# Patient Record
Sex: Female | Born: 1955 | Race: White | Hispanic: No | Marital: Single | Smoking: Current every day smoker
Health system: Southern US, Community
[De-identification: ages and names within clinical notes are randomized; demographics above are authoritative.]

## PROBLEM LIST (undated history)

## (undated) DIAGNOSIS — R918 Other nonspecific abnormal finding of lung field: Secondary | ICD-10-CM

## (undated) DIAGNOSIS — C3490 Malignant neoplasm of unspecified part of unspecified bronchus or lung: Secondary | ICD-10-CM

## (undated) DIAGNOSIS — J45909 Unspecified asthma, uncomplicated: Secondary | ICD-10-CM

## (undated) DIAGNOSIS — F1010 Alcohol abuse, uncomplicated: Secondary | ICD-10-CM

## (undated) DIAGNOSIS — I10 Essential (primary) hypertension: Secondary | ICD-10-CM

## (undated) DIAGNOSIS — J449 Chronic obstructive pulmonary disease, unspecified: Secondary | ICD-10-CM

## (undated) HISTORY — DX: Malignant neoplasm of unspecified part of unspecified bronchus or lung: C34.90

## (undated) HISTORY — PX: OTHER SURGICAL HISTORY: SHX169

## (undated) HISTORY — DX: Other nonspecific abnormal finding of lung field: R91.8

---

## 2004-10-21 ENCOUNTER — Ambulatory Visit: Payer: Self-pay

## 2005-11-22 ENCOUNTER — Ambulatory Visit: Payer: Self-pay | Admitting: Nurse Practitioner

## 2006-01-14 ENCOUNTER — Ambulatory Visit: Payer: Self-pay | Admitting: Nurse Practitioner

## 2007-01-20 ENCOUNTER — Ambulatory Visit: Payer: Self-pay | Admitting: Family Medicine

## 2011-04-27 HISTORY — PX: HIP SURGERY: SHX245

## 2012-10-24 ENCOUNTER — Inpatient Hospital Stay: Payer: Self-pay | Admitting: Orthopedic Surgery

## 2012-10-24 LAB — URINALYSIS, COMPLETE
Bilirubin,UR: NEGATIVE
Blood: NEGATIVE
Ketone: NEGATIVE
Ph: 6 (ref 4.5–8.0)
Protein: NEGATIVE
Squamous Epithelial: <1
WBC UR: <1 /HPF (ref 0–5)

## 2012-10-24 LAB — CBC
HCT: 30.3 % — ABNORMAL LOW (ref 35.0–47.0)
HGB: 10.9 g/dL — ABNORMAL LOW (ref 12.0–16.0)
MCV: 104 fL — ABNORMAL HIGH (ref 80–100)
Platelet: 326 10*3/uL (ref 150–440)
RBC: 2.93 10*6/uL — ABNORMAL LOW (ref 3.80–5.20)
WBC: 7.7 10*3/uL (ref 3.6–11.0)

## 2012-10-24 LAB — COMPREHENSIVE METABOLIC PANEL
Alkaline Phosphatase: 80 U/L (ref 50–136)
Anion Gap: 13 (ref 7–16)
Bilirubin,Total: 0.6 mg/dL (ref 0.2–1.0)
Calcium, Total: 8.4 mg/dL — ABNORMAL LOW (ref 8.5–10.1)
Chloride: 83 mmol/L — ABNORMAL LOW (ref 98–107)
Co2: 26 mmol/L (ref 21–32)
EGFR (African American): >60
EGFR (Non-African Amer.): >60
Glucose: 87 mg/dL (ref 65–99)
Potassium: 3.4 mmol/L — ABNORMAL LOW (ref 3.5–5.1)
SGOT(AST): 40 U/L — ABNORMAL HIGH (ref 15–37)
SGPT (ALT): 25 U/L (ref 12–78)
Sodium: 122 mmol/L — ABNORMAL LOW (ref 136–145)

## 2012-10-24 LAB — PROTIME-INR
INR: 0.9
Prothrombin Time: 12.1 secs (ref 11.5–14.7)

## 2012-10-24 LAB — FOLATE: Folic Acid: 8 ng/mL (ref 3.1–100.0)

## 2012-10-25 LAB — BASIC METABOLIC PANEL
Anion Gap: 8 (ref 7–16)
Calcium, Total: 8.6 mg/dL (ref 8.5–10.1)
Co2: 28 mmol/L (ref 21–32)
EGFR (African American): >60
EGFR (Non-African Amer.): >60
Glucose: 137 mg/dL — ABNORMAL HIGH (ref 65–99)
Potassium: 5 mmol/L (ref 3.5–5.1)
Sodium: 124 mmol/L — ABNORMAL LOW (ref 136–145)

## 2012-10-26 LAB — SODIUM: Sodium: 131 mmol/L — ABNORMAL LOW (ref 136–145)

## 2012-10-26 LAB — BASIC METABOLIC PANEL
BUN: 10 mg/dL (ref 7–18)
Calcium, Total: 8.5 mg/dL (ref 8.5–10.1)
Co2: 27 mmol/L (ref 21–32)
EGFR (Non-African Amer.): >60
Potassium: 3.9 mmol/L (ref 3.5–5.1)
Sodium: 129 mmol/L — ABNORMAL LOW (ref 136–145)

## 2012-10-26 LAB — URINE CULTURE

## 2012-10-27 LAB — BASIC METABOLIC PANEL
Calcium, Total: 8.2 mg/dL — ABNORMAL LOW (ref 8.5–10.1)
Co2: 30 mmol/L (ref 21–32)
EGFR (African American): >60
EGFR (Non-African Amer.): >60
Osmolality: 264 (ref 275–301)
Sodium: 133 mmol/L — ABNORMAL LOW (ref 136–145)

## 2012-10-27 LAB — CBC WITH DIFFERENTIAL/PLATELET
Basophil #: 0 10*3/uL (ref 0.0–0.1)
Basophil %: 0.2 %
Eosinophil #: 0 10*3/uL (ref 0.0–0.7)
Eosinophil %: 0.1 %
HGB: 7.4 g/dL — ABNORMAL LOW (ref 12.0–16.0)
Lymphocyte #: 1.5 10*3/uL (ref 1.0–3.6)
MCH: 38.1 pg — ABNORMAL HIGH (ref 26.0–34.0)
MCV: 106 fL — ABNORMAL HIGH (ref 80–100)
Monocyte #: 0.4 x10 3/mm (ref 0.2–0.9)
Monocyte %: 7.4 %
Neutrophil #: 3.5 10*3/uL (ref 1.4–6.5)
Neutrophil %: 64.7 %
RBC: 1.94 10*6/uL — ABNORMAL LOW (ref 3.80–5.20)
RDW: 12.7 % (ref 11.5–14.5)
WBC: 5.5 10*3/uL (ref 3.6–11.0)

## 2012-10-27 LAB — HEMOGLOBIN: HGB: 6.6 g/dL — ABNORMAL LOW (ref 12.0–16.0)

## 2012-10-28 LAB — BASIC METABOLIC PANEL
BUN: 4 mg/dL — ABNORMAL LOW (ref 7–18)
Creatinine: 0.67 mg/dL (ref 0.60–1.30)
EGFR (African American): >60
Glucose: 79 mg/dL (ref 65–99)

## 2012-10-29 LAB — BASIC METABOLIC PANEL
Anion Gap: 3 — ABNORMAL LOW (ref 7–16)
Calcium, Total: 8.4 mg/dL — ABNORMAL LOW (ref 8.5–10.1)
Chloride: 91 mmol/L — ABNORMAL LOW (ref 98–107)
Co2: 34 mmol/L — ABNORMAL HIGH (ref 21–32)
Creatinine: 0.68 mg/dL (ref 0.60–1.30)
EGFR (African American): >60
EGFR (Non-African Amer.): >60
Glucose: 96 mg/dL (ref 65–99)
Osmolality: 253 (ref 275–301)

## 2012-10-29 LAB — CBC WITH DIFFERENTIAL/PLATELET
Basophil #: 0 10*3/uL (ref 0.0–0.1)
Eosinophil #: 0 10*3/uL (ref 0.0–0.7)
HGB: 8.8 g/dL — ABNORMAL LOW (ref 12.0–16.0)
Lymphocyte #: 1.2 10*3/uL (ref 1.0–3.6)
MCHC: 35.4 g/dL (ref 32.0–36.0)
MCV: 97 fL (ref 80–100)
Monocyte #: 0.5 x10 3/mm (ref 0.2–0.9)
Monocyte %: 9.8 %
Neutrophil %: 66.4 %
Platelet: 275 10*3/uL (ref 150–440)
RBC: 2.57 10*6/uL — ABNORMAL LOW (ref 3.80–5.20)
WBC: 5 10*3/uL (ref 3.6–11.0)

## 2013-08-04 DIAGNOSIS — M549 Dorsalgia, unspecified: Secondary | ICD-10-CM | POA: Insufficient documentation

## 2013-08-04 DIAGNOSIS — I10 Essential (primary) hypertension: Secondary | ICD-10-CM | POA: Insufficient documentation

## 2013-08-04 DIAGNOSIS — E871 Hypo-osmolality and hyponatremia: Secondary | ICD-10-CM | POA: Insufficient documentation

## 2013-08-04 DIAGNOSIS — J439 Emphysema, unspecified: Secondary | ICD-10-CM | POA: Insufficient documentation

## 2013-08-04 DIAGNOSIS — M1990 Unspecified osteoarthritis, unspecified site: Secondary | ICD-10-CM | POA: Insufficient documentation

## 2013-08-29 DIAGNOSIS — E119 Type 2 diabetes mellitus without complications: Secondary | ICD-10-CM | POA: Insufficient documentation

## 2014-01-28 DIAGNOSIS — M533 Sacrococcygeal disorders, not elsewhere classified: Secondary | ICD-10-CM | POA: Insufficient documentation

## 2014-01-28 DIAGNOSIS — M5417 Radiculopathy, lumbosacral region: Secondary | ICD-10-CM | POA: Insufficient documentation

## 2014-03-01 DIAGNOSIS — E639 Nutritional deficiency, unspecified: Secondary | ICD-10-CM | POA: Insufficient documentation

## 2014-04-26 HISTORY — PX: HIP PINNING: SHX1757

## 2014-06-25 ENCOUNTER — Ambulatory Visit: Payer: Self-pay | Admitting: Physical Medicine and Rehabilitation

## 2014-08-16 NOTE — Consult Note (Signed)
PATIENT NAME:  Rachael Palmer, Rachael Palmer MR#:  [id number] DATE OF BIRTH:  1955-12-21  DATE OF CONSULTATION:  10/24/2012  REFERRING PHYSICIAN:  Laurene Footman, MD CONSULTING PHYSICIAN:  [place] Sink, MD ADMITTING PHYSICIAN:  Latina Craver, MD  CHIEF COMPLAINT: Right hip pain.   This is a very nice 59 year old female with history of COPD, asthma as a child, current smoker, chronic back pain, GERD, alcohol abuse, who comes today with a history of getting out of bed last night in her bedroom and tripping falling. At that moment, she had severe pain in her right hip and she was not able to get up. She lives with her mother and she called for her help. Her mother helped her get back in bed, where she stayed during the night, and this morning whenever she got out of bed she was not able to put any weight on her leg, for what she was brought in to the Emergency Department. On evaluation by the Emergency Department physician, she was found to have right hip intertrochanteric fracture, for what Dr. Rudene Christians was consulted. Dr. Rudene Christians evaluated the patient and scheduled her to have open reduction and internal fixation this afternoon. I was consulted for management of medical conditions and surgical clearance.   As far as her COPD, the patient states that she does not have COPD, although reviewing her history, she is already taking Spiriva inhalers and she is a current smoker. 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 I do not have any information about her last PFTs. The patient states that she does not remember ever having one. The patient has a really good ability to ambulate. Prior to this, she states that she can walk a mile whenever she goes to the beach and not being short of breath, but on further discussion, it is obvious that the patient does not go to the beach very often. She is not walking much at home because of her chronic pain. She cannot go up stairs,  mostly because of her chronic pain. The patient has not had any recent infection. The last time that she had a lung infection was a pneumonia and it was 2 years ago. She states that she is taking her flu shots and her pneumonia shots are current.   She does not have any coronary artery disease. She is a heavy smoker, but does not seem to have any established peripheral artery disease. She denies any claudication with ambulation. Her pulses are palpable.   Overall, the patient is at low to moderate risk for surgery based on her COPD status. This morning she was wheezing, for what we are going to maximize her therapy. We are going to give her steroids, inhalers, and continue her current inhaler (Spiriva). We are going to add on inhaled steroids and the choice we are going to use is Flovent. The patient is stable. She has been put on oxygen, but her oxygen saturations have been 100% on 2 liters. Overall, the patient is going to go for surgery today and she will be cleared for that.   REVIEW OF SYSTEMS: Twelve-system review of systems: CONSTITUTIONAL: No fever, fatigue. No weakness. No weight loss or weight gain recently.  EYES: No blurry vision, double vision or inflammation.  EARS, NOSE, THROAT: No difficulty swallowing. No significant changes in her voice recently. No postnasal drip. No nasal discharge.  RESPIRATORY: Positive cough and wheezing every morning as a baseline, and it  clears up during the day. Positive for asthma as a child and COPD as an adult. No painful respirations. No hemoptysis. No pneumonia recently, last time 2 years ago. Flu shot and pneumonia shot updated.  GASTROINTESTINAL: No nausea, vomiting, abdominal pain, constipation, diarrhea or melena.  CARDIOVASCULAR: No chest pain, orthopnea. No edema. No palpitations or syncope. At this moment, the patient takes a water pill because in the past she had lower extremity edema, but she denies any history of CHF.  GENITOURINARY: No dysuria,  hematuria or changes in frequency.  GYNECOLOGIC: No breast masses.  ENDOCRINE: No polyuria, polydipsia, polyphagia, cold or heat intolerance.  HEMATOLOGIC AND LYMPHATIC: No anemia that she knows of. No easy bruising or bleeding. The patient does have decreased hemoglobin evident right now, which is probably likely due to chronic disease anemia. SKIN: No rashes, petechiae or moles changing.  MUSCULOSKELETAL: Positive chronic back pain, chronic neck pain, positive hip pain in the right hip. No gout.  NEUROLOGIC: No numbness, tingling. No CVAs or TIAs. No memory loss. No seizures.  PSYCHIATRIC: Denies any depression, insomnia or agitation.   PAST MEDICAL HISTORY: 1.  COPD.  2.  History of asthma as a child/reactive airway disease.  3.  GERD.  4.  Hypertension.  5.  Chronic back pain.   ALLERGIES: THE PATIENT IS ALLERGIC TO ASPIRIN AND PENICILLIN.   SOCIAL HISTORY: The patient is a smoker, smokes all her life since 3. She smokes 1 pack a day. She drinks 6-pack of beers a day, sometimes more. She is disabled, on SSI due to her COPD and back pain, and she lives with her mother. She is not married. She has 3 pregnancies.   FAMILY HISTORY: Positive for some type of cancer in her aunt, diabetes in her aunt. Her mother had significant history of peptic ulcer disease with GI bleeding.   PAST SURGICAL HISTORY: Positive for open reduction and internal fixation of the right forearm and 3 C-sections.   CURRENT MEDICATIONS: Tramadol 50 mg 1 to 2 every 4 hours as needed for pain, torsemide 5 mg once a day, Spiriva 18 mcg once a day, ProAir HFA 90 mcg 2 puffs 4 times a day, Klor-Con 10 mEq once a day, diltiazem 60 mg 1 tablet 2 times a day, carisoprodol 350 mg take 1/2 tablet 3 times a day as needed for pain.   PRIMARY CARE PHYSICIAN: Dr. Newman Pies.   PHYSICAL EXAMINATION: VITAL SIGNS: Blood pressure 103/69, pulse 110, respirations 20, temperature 97.7.  GENERAL: The patient is alert, oriented x 3.  No acute distress. No respiratory distress. Hemodynamically stable. The patient is lying down on the bed and at this moment is not complaining of any pain.  HEENT: Pupils are equal and reactive. Extraocular movements are intact. Mucosae are moist. Anicteric sclerae. Pink conjunctivae. No oral lesions. No oropharyngeal exudates. Ears without any discharge. No external abnormalities. Nose without any nasal lesions or drainage. Head normocephalic, atraumatic.  NECK: Supple. No JVD. No thyromegaly. No adenopathy. No carotid bruits. No rigidity. No stridor.  RESPIRATORY: Decreased respiratory sounds in both bases. The patient was wheezing earlier, but it improved a little bit after oxygen and nebulizers. At this moment, her lungs are clear but with decreased respiratory sounds bilaterally. No dullness to percussion. No use of accessory muscles.  CARDIOVASCULAR: Regular rate and rhythm. No murmurs, rubs or gallops are appreciated. No displacement of PMI. No tenderness to palpation anterior chest wall.  GASTROINTESTINAL: Abdomen is soft, nontender, nondistended. No hepatosplenomegaly. No masses.  Bowel sounds are positive.  GENITOURINARY: No external lesions.  EXTREMITIES: No edema, cyanosis or clubbing. Pulses +2. Capillary refill less than 3 seconds.  MUSCULOSKELETAL: Deformity of right hip with external rotation. Tenderness to palpation. No significant hematomas. No other joint deformity. No swelling of any joints. Back is nontender to palpation of spinal processes.  SKIN: Dry skin. No rashes, petechiae. Decreased turgor.  LYMPHATIC: Negative for lymphadenopathy in neck or supraclavicular areas.  VASCULAR: Pulses +2. Capillary refill less than 3 distally.  NEUROLOGIC: Cranial nerves II through XII intact. Sensation is normal distally. Strength seems to be equal in upper extremities. Unable to compare lower extremities due to the right hip fracture.  PSYCHIATRIC: Negative for agitation. The patient is alert,  oriented x 3, cooperative.   LABORATORY, DIAGNOSTIC AND RADIOLOGICAL DATA: EKG: Normal sinus rhythm. No ST depression or elevation. No significant abnormalities. No LVH. Sodium is 122 glucose 87, BUN 5, creatinine 0.63, potassium 3.4, calcium 8.4. AST is elevated at 40, other LFTs within normal limits. White blood count is 7.7; hemoglobin is 10.9, platelet count 326, MCV elevated, MCH elevated. INR is 0.9. Urinalysis: No red blood cells, no white blood cells, no nitrites or leukocyte esterase. Chest x-ray: No evidence of acute cardiopulmonary abnormalities. Underlying COPD.  ASSESSMENT AND PLAN: A 59 year old female with history of chronic obstructive pulmonary disease, hypertension, smoker, alcoholic, comes with a hip fracture of the right intertrochanteric hip.  1.  Hip fracture: The patient is cleared for surgery at this moment. She is low to moderate risk based on chronic obstructive pulmonary disease. She does not have any coronary artery disease or any evident peripheral vascular disease. The patient is going to be maximized in her treatment. Nebulizers every 4 hours with albuterol, Atrovent. Continue her home Spiriva. Continue steroids right now, as the patient was wheezing this morning, low dose, 40 mg every 8 hours and taper down after surgery. Continue oxygen supplementation as needed. The patient has not had any recent pulmonary infection. She is updated on her flu shot and pneumonia shot. The patient is at this moment stable, not in any significant respiratory distress, for what it will be okay to proceed with the surgical procedure. The patient does not have any active coronary artery disease or congestive heart failure. She does take diuretics, but these have been prescribed because of high blood pressure and lower extremity edema. I do not have an echo on chart. I do not think at this moment it is going to be necessary, although we are going to be careful with fluid management. I am changing the  fluids ordered by orthopedics to normal saline due to her low sodium. 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  2.  Hypertension: The patient has stable high blood pressure. Continue diltiazem.  3.  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 Continue normal saline and CIWA protocol.  4.  EtOH: CIWA protocol, as the patient is a drinker, drinks more than a 6-pack a day.  5.  Tobacco cessation counseling has been given to the patient for 4 minutes. The patient states that she does not want to quit smoking. Education given to the patient. 6.  Deep vein thrombosis prophylaxis with Lovenox after surgery.  7.  Gastrointestinal prophylaxis: Add Protonix, as the patient is going to be on  steroids.   TIME SPENT: I spent about 45 minutes with this patient.   Thank you, Dr. Rudene Christians, for allowing me to participate in the care of your nice patient. Will follow up along with you. Please call me with any questions.   ____________________________ Zavala Sink, MD [email or web address] D: 10/24/2012 12:48:11 ET T: 10/24/2012 13:12:57 ET JOB#: [id number]  cc: Boaz Sink, MD, <Dictator> Alphons Burgert America Brown MD ELECTRONICALLY SIGNED 10/25/2012 15:15

## 2014-08-16 NOTE — Discharge Summary (Signed)
PATIENT NAME:  Rachael Palmer, Rachael Palmer MR#:  [id number] DATE OF BIRTH:  December 02, 1955  Dictated for Hessie Knows, MD  DATE OF ADMISSION:  10/24/2012 DATE OF DISCHARGE:  10/29/2012  ADMITTING DIAGNOSIS: Right proximal femur fracture.   DISCHARGE DIAGNOSIS: Right proximal femur fracture.   CONSULTATIONS: Dr. Laurin Coder, hospitalist;  FAMILY DR. Dr. Frazier Richards.   HISTORY: The patient is a 59 year old who suffered a fall on the night prior to her admission in her bedroom. She was unable to get up. Her mother has to assist her back in bed. She stayed there during the night and on the morning of admission was unable to bear weight and subsequently was brought to [facility] ER, where x-rays were taken which revealed an intertrochanteric hip fracture. The patient had been previously ambulating without any assistive devices. She had not been having problems with walking, although she had  trouble with breathing at times with certain activities. After discussion of the risks and benefits of surgical intervention, the patient expressed her understanding of the risks and benefits and agreed with plans for surgical intervention. Subsequently, she was admitted to the hospital.   PROCEDURE: Open reduction and internal fixation of right intertrochanteric hip fracture, closed reduction right fifth finger.   IMPLANTS UTILIZED: Biomet Affixus hip fracture nail, right 130-degree, 9 x 340 mm with a 95 mm lag screw and 34 mm cortical bone locking screw.   The patient tolerated the procedure very well. She had no complications. She was then taken to the PACU where she was stabilized, then transferred to the orthopedic floor. She began receiving anticoagulation therapy of Lovenox 30 mg subQ every 12 hours per anesthesia and pharmacy protocol. She was fitted with TED stockings bilaterally. These were allowed to be removed  1 hour per 8 hour shift. The patient was also fitted with the AVI compression foot  pumps bilaterally, set at 80 mmHg. Her calves have been nontender. There has been no evidence of any DVTs to the lower extremity. Negative Homans sign. Heels were elevated off the bed using rolled towels.   The patient has denied any chest pains or shortness of breath. Vital signs have been stable. She has been afebrile. Hemodynamically, the patient's hemoglobin did drop on day 3 to 7.4. A repeat hemoglobin that same day she had dropped to 6.6. Subsequently she was given 1 unit of packed RBCs. Followup hemoglobin was 9.2.   Physical therapy was initiated on day 1 for gait training and transfers. She has done very well. Upon being discharged, she was ambulating greater than 100 feet. She was able go up and down 4 steps with independent bed-to-chair transfers. Occupational therapy was also initiated on day  1 for ADL and assistive devices.   The patient's IV, Foley and Hemovac were DC'd day 2, along with a dressing change. The wounds were free of any drainage or signs of infection. Minimal to no swelling was noted to the thigh. She has excellent range of motion of the knee and the hip. Minimal discomfort was noted. She tolerated exercise in bed very well.   The patient is being discharged to home in improved, stable condition.   DISCHARGE INSTRUCTIONS: She may weight-bear as tolerated. Continue using a walker until cleared by physical therapy. Go to a quad cane. The patient is to continue with the TED stockings. These are to be worn during the day, but may be removed at night.   She was placed on a regular diet. Change dressing as needed.  She will need to follow up in the clinic in 2 weeks, sooner if any temperatures of 101.5 or greater, or excessive bleeding. She is not to take a shower until the staples are removed.   She is to resume her regular medications that she was on prior to admission.   She was given a prescription for Norco, 1 to 2 tablets q. 4 to 6 hours p.r.n. for pain, Tramadol    50 to 100 mg every 4 to 6 hours p.r.n. for pain, and Lovenox 40 mg subcutaneously daily for 14 days.   She was instructed that she needed to discontinue her smoking or at least significantly reduce her smoking due to the fact that this interferes with the healing process of the tissue and of the bone. The patient states that she understands.   DRUG ALLERGIES: ASPIRIN AND PENICILLIN.   PAST MEDICAL HISTORY: 1.  Chronic obstructive pulmonary disease; reactive airway disease.  2.  Hypertension.  3.  Chronic low back pain.  4.  Depression.     ____________________________ Vance Peper, PA [id number] D: 10/29/2012 08:16:17 ET T: 10/29/2012 10:47:29 ET JOB#: [id number]  cc: Vance Peper, PA, <Dictator> Jemia Fata PA ELECTRONICALLY SIGNED 10/31/2012 22:14

## 2014-08-16 NOTE — H&P (Signed)
PATIENT NAME:  Rachael Palmer, MEFFERD MR#:  [id number] DATE OF BIRTH:  [birth year]  DATE OF ADMISSION:  10/24/2012  CHIEF COMPLAINT: Right hip pain.   HISTORY OF PRESENT ILLNESS: The patient is a 59 year old, who suffered a fall last night in her bedroom. She could not get up. Her mother got her back into bed. She stayed there all night and this morning was unable to bear weight. She was brought into the Emergency Room for evaluation and treatment. She was found to have an intertrochanteric hip fracture and is being admitted for treatment of this. She has been previously ambulatory without assistive device. She has not had trouble with walking, although she does have trouble with breathing that sometimes affects activities.   PAST MEDICAL HISTORY: Remarkable for COPD, reactive airway disease, hypertension, she also has a history of chronic back pain and depression.   SOCIAL HISTORY: She smokes a pack per day. She is a nondrinker and is unable to work secondary to COPD.   FAMILY HISTORY: Positive for liver disease and asthma.   MEDICATIONS ON ADMISSION: Tramadol 50 mg as needed for pain, soma 350 mg half to 1 tablet t.i.d. as needed for spasm and back pain, Pro-Air HFA inhaler 2 puffs q.4 to 6 hours p.r.n. for wheezing, trazodone 100 mg daily at night, diltiazem 60 mg b.i.d., torsemide 5 mg daily, Klor-Con 10 mEq b.i.d. and Spiriva 1 puff daily.  ALLERGIES: ASPIRIN AND PENICILLIN.   REVIEW OF SYSTEMS: Positive just for right hip pain.   PHYSICAL EXAMINATION:  GENERAL: 5 white female, who appears older than her stated age, in a great deal of distress secondary to hip pain.  HEENT: Unremarkable.  LUNGS: She has inspiratory and expiratory wheezing.  HEART: Regular rate and rhythm. EXTREMITIES: She has a shortened and externally rotated right lower extremity with palpable pulses. Sensation intact. Skin intact.   X-rays show a comminuted intertrochanteric fracture, right proximal femur.   CLINICAL  IMPRESSION: Right proximal femur fracture.   PLAN: ORIF with a long rod later today if medically cleared. The risks, benefits, and possible complications were discussed.    ____________________________ Laurene Footman, MD [email or web address] D: 10/24/2012 11:08:50 ET T: 10/24/2012 11:21:45 ET JOB#: [id number]  cc: Laurene Footman, MD, <Dictator> Laurene Footman MD ELECTRONICALLY SIGNED 10/24/2012 17:41

## 2014-08-16 NOTE — Op Note (Signed)
PATIENT NAME:  Rachael Palmer, Rachael Palmer MR#:  [id number] DATE OF BIRTH:  Sep 15, 1955  DATE OF PROCEDURE:  10/26/2012  PREOPERATIVE DIAGNOSIS:  Right intertrochanteric hip fracture, right little finger proximal phalanx fracture.   POSTOPERATIVE DIAGNOSIS:  Right intertrochanteric hip fracture, right little finger proximal phalanx fracture.   PROCEDURE:  ORIF right intertrochanteric hip fracture, closed reduction right fifth finger.   ANESTHESIA:  Spinal.   SURGEON:  Laurene Footman, M.D.   DESCRIPTION OF PROCEDURE:  The patient was brought to the Operating Room and after a spinal anesthesia was obtained with the patient still having some sedation, a timeout procedure was carried out and the little finger reduction was carried out using the pencil technique.  A pen was placed at the base of the proximal phalanx and with the finger in extension, radial deviated pressure was applied and there was a pop and the ulnar deviation was corrected.  Buddy-taping was then carried out to the ring finger.  The patient was then transferred to the fracture table with a left leg in the well leg holder, right foot in the traction boot.  With traction applied there was a near anatomic alignment.  The hip was then prepped and draped using the barrier drape method.  After patient identification and timeout procedure were carried out again, a proximal skin incision was made and a guidewire inserted through the tip of the trochanter.  Proximal reaming carried out and a guidewire inserted down the canal and rod length chosen.  Reaming was carried out with an 11 mm reamer and this was a snug fit so the 9 x 340 mm rod was obtained and inserted down the canal.  The proximal lag screw was then inserted through a small lateral incision getting into the center/center of the head.  First measuring the  guidewire, drilling and then placing a 95 mm lag screw.  This was then set after compression was applied at the fracture site and having released  traction.  Next, attention was turned distally where through the slotted hole, a small stab incision was made with drill and then placing of a 34 mm cortical bone screw, 5 mm in diameter.  After all hardware was in place and permanent C-arm views were obtained, the wounds were irrigated and closed with #1 Vicryl deep, 2-0 Vicryl subcutaneously and skin staples.  Xeroform, 4 x 4's, ABD and tape applied.  The patient was sent to the recovery room in stable condition.   ESTIMATED BLOOD LOSS:  150 mL.   COMPLICATIONS:  None.   SPECIMEN:  None.   IMPLANTS:  Biomet Affixus hip fracture nail, right 130 degree, 9 x 340 mm with a 95 mm lag screw and 34 mm cortical bone locking screw.    ____________________________ Laurene Footman, MD [email or web address]:[id number] D: 10/26/2012 19:40:08 ET T: 10/27/2012 04:34:03 ET JOB#: [id number]  cc: Laurene Footman, MD, <Dictator> Laurene Footman MD ELECTRONICALLY SIGNED 10/29/2012 19:37

## 2014-08-16 NOTE — Consult Note (Signed)
Brief Consult Note: Diagnosis: hip fracture, copd, etoh,.   Patient was seen by consultant.   Recommend to proceed with surgery or procedure.   Orders entered.   Comments: 59 yo femael witht copd/ asthma smoker and etoh abuse. surgery today. maximize tx for copd. cont spiriva, add inhaled steroid. nebs scheduled q4, sterids '40mg'$  q 8hs as per was wheezing this am. ivf change to ns as pt has low na likely due to beer potomania. ciwa protocol.  Electronic Signatures for Addendum Section:   Sink (MD) (Signed Addendum 02-Jul-14 00:40)  REPEATED NA PRIOR TO THE SURGERY IS LOWER.120 SURGERY CANCELLED. PT WILL GET AN INCREASE ON IVF UP TO 80ML, + BANANA BAG.   Electronic Signatures: James Ivanoff, Roselie Awkward (MD)  (Signed 01-Jul-14 12:30)  Authored: Brief Consult Note   Last Updated: 02-Jul-14 00:40 by James Ivanoff, Roselie Awkward (MD)

## 2014-09-16 DIAGNOSIS — S32000A Wedge compression fracture of unspecified lumbar vertebra, initial encounter for closed fracture: Secondary | ICD-10-CM | POA: Insufficient documentation

## 2014-11-01 ENCOUNTER — Other Ambulatory Visit: Payer: Self-pay | Admitting: Internal Medicine

## 2014-11-01 DIAGNOSIS — R937 Abnormal findings on diagnostic imaging of other parts of musculoskeletal system: Secondary | ICD-10-CM

## 2014-11-01 DIAGNOSIS — R911 Solitary pulmonary nodule: Secondary | ICD-10-CM

## 2014-11-04 ENCOUNTER — Ambulatory Visit
Admission: RE | Admit: 2014-11-04 | Discharge: 2014-11-04 | Disposition: A | Discharge status: Home / Self Care | Payer: Medicare Other | Source: Ambulatory Visit | Attending: Internal Medicine | Admitting: Internal Medicine

## 2014-11-04 DIAGNOSIS — Z09 Encounter for follow-up examination after completed treatment for conditions other than malignant neoplasm: Secondary | ICD-10-CM | POA: Diagnosis present

## 2014-11-04 DIAGNOSIS — M4856XA Collapsed vertebra, not elsewhere classified, lumbar region, initial encounter for fracture: Secondary | ICD-10-CM | POA: Insufficient documentation

## 2014-11-04 DIAGNOSIS — M4854XA Collapsed vertebra, not elsewhere classified, thoracic region, initial encounter for fracture: Secondary | ICD-10-CM | POA: Diagnosis not present

## 2014-11-04 DIAGNOSIS — M5134 Other intervertebral disc degeneration, thoracic region: Secondary | ICD-10-CM | POA: Diagnosis not present

## 2014-11-04 DIAGNOSIS — R937 Abnormal findings on diagnostic imaging of other parts of musculoskeletal system: Secondary | ICD-10-CM

## 2014-11-04 DIAGNOSIS — M2578 Osteophyte, vertebrae: Secondary | ICD-10-CM | POA: Diagnosis not present

## 2014-11-04 DIAGNOSIS — M5124 Other intervertebral disc displacement, thoracic region: Secondary | ICD-10-CM | POA: Diagnosis not present

## 2014-11-04 DIAGNOSIS — R911 Solitary pulmonary nodule: Secondary | ICD-10-CM

## 2014-11-04 HISTORY — DX: Unspecified asthma, uncomplicated: J45.909

## 2014-11-04 HISTORY — DX: Essential (primary) hypertension: I10

## 2014-11-04 MED ORDER — IOHEXOL 300 MG/ML  SOLN
50.0000 mL | Freq: Once | INTRAMUSCULAR | Status: AC | PRN
  Administered 2014-11-04: 50 mL via INTRAVENOUS

## 2014-11-07 ENCOUNTER — Other Ambulatory Visit: Payer: Medicare Other | Admitting: Specialist

## 2014-11-07 DIAGNOSIS — R918 Other nonspecific abnormal finding of lung field: Secondary | ICD-10-CM

## 2014-11-14 ENCOUNTER — Encounter
Admission: RE | Admit: 2014-11-14 | Discharge: 2014-11-14 | Disposition: A | Discharge status: Home / Self Care | Payer: Medicare Other | Source: Ambulatory Visit | Attending: Specialist | Admitting: Specialist

## 2014-11-14 DIAGNOSIS — J984 Other disorders of lung: Secondary | ICD-10-CM | POA: Insufficient documentation

## 2014-11-14 DIAGNOSIS — R918 Other nonspecific abnormal finding of lung field: Secondary | ICD-10-CM

## 2014-11-14 LAB — GLUCOSE, CAPILLARY: Glucose-Capillary: 81 mg/dL (ref 65–99)

## 2014-11-14 MED ORDER — FLUDEOXYGLUCOSE F - 18 (FDG) INJECTION
12.3600 | Freq: Once | INTRAVENOUS | Status: AC | PRN
  Administered 2014-11-14: 12.36 via INTRAVENOUS

## 2014-11-29 ENCOUNTER — Inpatient Hospital Stay
Admission: EM | Admit: 2014-11-29 | Discharge: 2014-12-03 | DRG: 480 | Disposition: A | Discharge status: Home Health | Payer: Medicare Other | Attending: Internal Medicine | Admitting: Internal Medicine

## 2014-11-29 ENCOUNTER — Emergency Department: Payer: Medicare Other

## 2014-11-29 ENCOUNTER — Encounter: Payer: Self-pay | Admitting: Emergency Medicine

## 2014-11-29 DIAGNOSIS — J45909 Unspecified asthma, uncomplicated: Secondary | ICD-10-CM | POA: Diagnosis present

## 2014-11-29 DIAGNOSIS — E871 Hypo-osmolality and hyponatremia: Secondary | ICD-10-CM | POA: Diagnosis present

## 2014-11-29 DIAGNOSIS — E43 Unspecified severe protein-calorie malnutrition: Secondary | ICD-10-CM | POA: Insufficient documentation

## 2014-11-29 DIAGNOSIS — R918 Other nonspecific abnormal finding of lung field: Secondary | ICD-10-CM | POA: Diagnosis present

## 2014-11-29 DIAGNOSIS — Y92009 Unspecified place in unspecified non-institutional (private) residence as the place of occurrence of the external cause: Secondary | ICD-10-CM | POA: Diagnosis not present

## 2014-11-29 DIAGNOSIS — M1990 Unspecified osteoarthritis, unspecified site: Secondary | ICD-10-CM | POA: Diagnosis present

## 2014-11-29 DIAGNOSIS — Z6820 Body mass index (BMI) 20.0-20.9, adult: Secondary | ICD-10-CM

## 2014-11-29 DIAGNOSIS — Z7982 Long term (current) use of aspirin: Secondary | ICD-10-CM

## 2014-11-29 DIAGNOSIS — Z96641 Presence of right artificial hip joint: Secondary | ICD-10-CM | POA: Diagnosis present

## 2014-11-29 DIAGNOSIS — W010XXA Fall on same level from slipping, tripping and stumbling without subsequent striking against object, initial encounter: Secondary | ICD-10-CM | POA: Diagnosis present

## 2014-11-29 DIAGNOSIS — Z8249 Family history of ischemic heart disease and other diseases of the circulatory system: Secondary | ICD-10-CM | POA: Diagnosis not present

## 2014-11-29 DIAGNOSIS — S72009A Fracture of unspecified part of neck of unspecified femur, initial encounter for closed fracture: Secondary | ICD-10-CM

## 2014-11-29 DIAGNOSIS — F17210 Nicotine dependence, cigarettes, uncomplicated: Secondary | ICD-10-CM | POA: Diagnosis present

## 2014-11-29 DIAGNOSIS — K7460 Unspecified cirrhosis of liver: Secondary | ICD-10-CM | POA: Diagnosis present

## 2014-11-29 DIAGNOSIS — M4854XA Collapsed vertebra, not elsewhere classified, thoracic region, initial encounter for fracture: Secondary | ICD-10-CM | POA: Diagnosis present

## 2014-11-29 DIAGNOSIS — J449 Chronic obstructive pulmonary disease, unspecified: Secondary | ICD-10-CM | POA: Diagnosis present

## 2014-11-29 DIAGNOSIS — Z880 Allergy status to penicillin: Secondary | ICD-10-CM

## 2014-11-29 DIAGNOSIS — M8580 Other specified disorders of bone density and structure, unspecified site: Secondary | ICD-10-CM | POA: Diagnosis present

## 2014-11-29 DIAGNOSIS — Z7983 Long term (current) use of bisphosphonates: Secondary | ICD-10-CM | POA: Diagnosis not present

## 2014-11-29 DIAGNOSIS — G8929 Other chronic pain: Secondary | ICD-10-CM | POA: Diagnosis present

## 2014-11-29 DIAGNOSIS — S72002A Fracture of unspecified part of neck of left femur, initial encounter for closed fracture: Secondary | ICD-10-CM

## 2014-11-29 DIAGNOSIS — I10 Essential (primary) hypertension: Secondary | ICD-10-CM | POA: Diagnosis present

## 2014-11-29 DIAGNOSIS — R64 Cachexia: Secondary | ICD-10-CM | POA: Diagnosis present

## 2014-11-29 DIAGNOSIS — F1010 Alcohol abuse, uncomplicated: Secondary | ICD-10-CM | POA: Diagnosis present

## 2014-11-29 DIAGNOSIS — S72142A Displaced intertrochanteric fracture of left femur, initial encounter for closed fracture: Principal | ICD-10-CM | POA: Diagnosis present

## 2014-11-29 DIAGNOSIS — Z7952 Long term (current) use of systemic steroids: Secondary | ICD-10-CM

## 2014-11-29 DIAGNOSIS — E876 Hypokalemia: Secondary | ICD-10-CM | POA: Diagnosis not present

## 2014-11-29 DIAGNOSIS — R0602 Shortness of breath: Secondary | ICD-10-CM

## 2014-11-29 DIAGNOSIS — Z79899 Other long term (current) drug therapy: Secondary | ICD-10-CM

## 2014-11-29 HISTORY — DX: Chronic obstructive pulmonary disease, unspecified: J44.9

## 2014-11-29 HISTORY — DX: Alcohol abuse, uncomplicated: F10.10

## 2014-11-29 HISTORY — DX: Other nonspecific abnormal finding of lung field: R91.8

## 2014-11-29 LAB — BASIC METABOLIC PANEL
ANION GAP: 14 (ref 5–15)
BUN: <5 mg/dL — ABNORMAL LOW (ref 6–20)
CO2: 29 mmol/L (ref 22–32)
Calcium: 8.4 mg/dL — ABNORMAL LOW (ref 8.9–10.3)
Chloride: 73 mmol/L — ABNORMAL LOW (ref 101–111)
Creatinine, Ser: 0.36 mg/dL — ABNORMAL LOW (ref 0.44–1.00)
GFR calc Af Amer: >60 mL/min (ref >60)
GFR calc non Af Amer: >60 mL/min (ref >60)
GLUCOSE: 73 mg/dL (ref 65–99)
Potassium: 3.8 mmol/L (ref 3.5–5.1)
Sodium: 116 mmol/L — CL (ref 135–145)

## 2014-11-29 LAB — TROPONIN I: Troponin I: <0.03 ng/mL (ref <0.031)

## 2014-11-29 LAB — TYPE AND SCREEN
ABO/RH(D): O POS
ANTIBODY SCREEN: NEGATIVE

## 2014-11-29 LAB — CBC
HCT: 35.7 % (ref 35.0–47.0)
Hemoglobin: 12.6 g/dL (ref 12.0–16.0)
MCH: 35.8 pg — ABNORMAL HIGH (ref 26.0–34.0)
MCHC: 35.2 g/dL (ref 32.0–36.0)
MCV: 101.6 fL — AB (ref 80.0–100.0)
PLATELETS: 434 10*3/uL (ref 150–440)
RBC: 3.52 MIL/uL — ABNORMAL LOW (ref 3.80–5.20)
RDW: 12.6 % (ref 11.5–14.5)
WBC: 7.2 10*3/uL (ref 3.6–11.0)

## 2014-11-29 LAB — ETHANOL: Alcohol, Ethyl (B): 150 mg/dL — ABNORMAL HIGH (ref <5)

## 2014-11-29 LAB — PROTIME-INR
INR: 0.97
Prothrombin Time: 13.1 seconds (ref 11.4–15.0)

## 2014-11-29 LAB — APTT: aPTT: 35 seconds (ref 24–36)

## 2014-11-29 LAB — ABO/RH: ABO/RH(D): O POS

## 2014-11-29 LAB — ALBUMIN: Albumin: 3.8 g/dL (ref 3.5–5.0)

## 2014-11-29 MED ORDER — FOLIC ACID 1 MG PO TABS
1.0000 mg | ORAL_TABLET | Freq: Every day | ORAL | Status: DC
  Administered 2014-11-29 – 2014-12-03 (×4): 1 mg via ORAL
  Filled 2014-11-29 (×4): qty 1

## 2014-11-29 MED ORDER — VITAMIN B-1 100 MG PO TABS
100.0000 mg | ORAL_TABLET | Freq: Every day | ORAL | Status: DC
  Administered 2014-11-30 – 2014-12-03 (×3): 100 mg via ORAL
  Filled 2014-11-29 (×4): qty 1

## 2014-11-29 MED ORDER — OXYCODONE HCL 5 MG PO TABS
5.0000 mg | ORAL_TABLET | ORAL | Status: DC | PRN
  Administered 2014-11-29: 10 mg via ORAL
  Administered 2014-11-30 – 2014-12-03 (×6): 5 mg via ORAL
  Filled 2014-11-29: qty 2
  Filled 2014-11-29 (×6): qty 1

## 2014-11-29 MED ORDER — ONDANSETRON HCL 4 MG PO TABS: 4.0000 mg | ORAL_TABLET | Freq: Four times a day (QID) | ORAL | Status: DC | PRN

## 2014-11-29 MED ORDER — ONDANSETRON HCL 4 MG/2ML IJ SOLN: 4.0000 mg | Freq: Four times a day (QID) | INTRAMUSCULAR | Status: DC | PRN

## 2014-11-29 MED ORDER — ALBUTEROL SULFATE (2.5 MG/3ML) 0.083% IN NEBU
3.0000 mL | INHALATION_SOLUTION | RESPIRATORY_TRACT | Status: DC | PRN
  Administered 2014-11-30: 3 mL via RESPIRATORY_TRACT
  Filled 2014-11-29: qty 3

## 2014-11-29 MED ORDER — ALENDRONATE SODIUM 70 MG PO TABS: 70.0000 mg | ORAL_TABLET | ORAL | Status: DC

## 2014-11-29 MED ORDER — TORSEMIDE 5 MG PO TABS
5.0000 mg | ORAL_TABLET | Freq: Every day | ORAL | Status: DC
  Administered 2014-11-30 – 2014-12-02 (×2): 5 mg via ORAL
  Filled 2014-11-29 (×3): qty 1

## 2014-11-29 MED ORDER — MORPHINE SULFATE 2 MG/ML IJ SOLN
1.0000 mg | INTRAMUSCULAR | Status: DC | PRN
  Administered 2014-11-29 – 2014-12-03 (×10): 1 mg via INTRAVENOUS
  Filled 2014-11-29 (×12): qty 1

## 2014-11-29 MED ORDER — PREDNISONE 20 MG PO TABS
20.0000 mg | ORAL_TABLET | Freq: Every day | ORAL | Status: DC
  Administered 2014-11-30 – 2014-12-03 (×3): 20 mg via ORAL
  Filled 2014-11-29 (×3): qty 1

## 2014-11-29 MED ORDER — MIRTAZAPINE 15 MG PO TABS
7.5000 mg | ORAL_TABLET | Freq: Every day | ORAL | Status: DC
  Administered 2014-11-29 – 2014-12-02 (×4): 7.5 mg via ORAL
  Filled 2014-11-29 (×4): qty 1

## 2014-11-29 MED ORDER — TRAZODONE HCL 100 MG PO TABS
100.0000 mg | ORAL_TABLET | Freq: Every day | ORAL | Status: DC
  Administered 2014-11-29 – 2014-12-02 (×4): 100 mg via ORAL
  Filled 2014-11-29 (×5): qty 1

## 2014-11-29 MED ORDER — ADULT MULTIVITAMIN W/MINERALS CH
1.0000 | ORAL_TABLET | Freq: Every day | ORAL | Status: DC
  Administered 2014-11-29 – 2014-12-03 (×4): 1 via ORAL
  Filled 2014-11-29 (×5): qty 1

## 2014-11-29 MED ORDER — THIAMINE HCL 100 MG/ML IJ SOLN
100.0000 mg | Freq: Every day | INTRAMUSCULAR | Status: DC
  Administered 2014-11-29: 100 mg via INTRAVENOUS
  Filled 2014-11-29 (×2): qty 2

## 2014-11-29 MED ORDER — ACETAMINOPHEN 650 MG RE SUPP: 650.0000 mg | Freq: Four times a day (QID) | RECTAL | Status: DC | PRN

## 2014-11-29 MED ORDER — TIOTROPIUM BROMIDE MONOHYDRATE 18 MCG IN CAPS
18.0000 ug | ORAL_CAPSULE | Freq: Every day | RESPIRATORY_TRACT | Status: DC
  Administered 2014-11-30 – 2014-12-03 (×3): 18 ug via RESPIRATORY_TRACT
  Filled 2014-11-29: qty 5

## 2014-11-29 MED ORDER — DILTIAZEM HCL 30 MG PO TABS
60.0000 mg | ORAL_TABLET | Freq: Two times a day (BID) | ORAL | Status: DC
  Administered 2014-11-29 – 2014-12-03 (×8): 60 mg via ORAL
  Filled 2014-11-29 (×8): qty 2

## 2014-11-29 MED ORDER — SODIUM CHLORIDE 0.9 % IV SOLN
INTRAVENOUS | Status: DC
  Administered 2014-11-29 – 2014-12-02 (×4): via INTRAVENOUS

## 2014-11-29 MED ORDER — ASPIRIN 81 MG PO CHEW
81.0000 mg | CHEWABLE_TABLET | Freq: Every day | ORAL | Status: DC
  Administered 2014-12-02 – 2014-12-03 (×2): 81 mg via ORAL
  Filled 2014-11-29 (×3): qty 1

## 2014-11-29 MED ORDER — SODIUM CHLORIDE 0.9 % IV BOLUS (SEPSIS)
1000.0000 mL | Freq: Once | INTRAVENOUS | Status: AC
  Administered 2014-11-29: 1000 mL via INTRAVENOUS

## 2014-11-29 MED ORDER — LORAZEPAM 2 MG/ML IJ SOLN
1.0000 mg | Freq: Four times a day (QID) | INTRAMUSCULAR | Status: AC | PRN
  Administered 2014-11-29: 1 mg via INTRAVENOUS
  Filled 2014-11-29: qty 1

## 2014-11-29 MED ORDER — MORPHINE SULFATE 4 MG/ML IJ SOLN
4.0000 mg | Freq: Once | INTRAMUSCULAR | Status: AC
  Administered 2014-11-29: 4 mg via INTRAVENOUS
  Filled 2014-11-29: qty 1

## 2014-11-29 MED ORDER — ACETAMINOPHEN 325 MG PO TABS: 650.0000 mg | ORAL_TABLET | Freq: Four times a day (QID) | ORAL | Status: DC | PRN

## 2014-11-29 MED ORDER — LORAZEPAM 1 MG PO TABS: 1.0000 mg | ORAL_TABLET | Freq: Four times a day (QID) | ORAL | Status: AC | PRN

## 2014-11-29 MED ORDER — ONDANSETRON HCL 4 MG/2ML IJ SOLN
4.0000 mg | Freq: Once | INTRAMUSCULAR | Status: AC
  Administered 2014-11-29: 4 mg via INTRAVENOUS
  Filled 2014-11-29: qty 2

## 2014-11-29 MED ORDER — MORPHINE SULFATE 2 MG/ML IJ SOLN
2.0000 mg | INTRAMUSCULAR | Status: DC | PRN
  Administered 2014-11-29: 2 mg via INTRAVENOUS
  Filled 2014-11-29: qty 1

## 2014-11-29 MED ORDER — POTASSIUM CHLORIDE ER 10 MEQ PO TBCR
10.0000 meq | EXTENDED_RELEASE_TABLET | Freq: Every day | ORAL | Status: DC
  Administered 2014-11-30 – 2014-12-03 (×3): 10 meq via ORAL
  Filled 2014-11-29 (×7): qty 1

## 2014-11-29 NOTE — H&P (Signed)
[facility] NAME: Rachael Palmer    MR#:  [id number]  DATE OF BIRTH:  June 30, 1955  DATE OF ADMISSION:  11/29/2014  PRIMARY CARE PHYSICIAN: Kirk Ruths., MD   REQUESTING/REFERRING PHYSICIAN: Dr. Lisa Roca.   CHIEF COMPLAINT:   Chief Complaint  Patient presents with  . Hip Pain   Status post fall and left hip pain  HISTORY OF PRESENT ILLNESS:  Rachael Palmer  is a 59 y.o. female with a known history of hypertension, COPD, lung mass suspicious for cancer, ongoing tobacco abuse, alcohol abuse, who presents to the hospital after a fall and noted to have a left hip fracture.  Patient's mother was apparently washing the patient's hair and when she got out of the chair she slipped and lost her step and fell to the left side. She was having some left hip pain and therefore brought to the ER and noted to have a left hip fracture. On blood work patient was also noted to be hyponatremic with a sodium 116. Hospitalist services were contacted further treatment and evaluation. Patient denies any chest pain, nausea, vomiting, diarrhea, fever, chills or any other associated symptoms presently.  PAST MEDICAL HISTORY:   Past Medical History  Diagnosis Date  . Asthma   . Hypertension   . Lung mass   . COPD (chronic obstructive pulmonary disease)   . Alcohol abuse     PAST SURGICAL HISTORY:   Past Surgical History  Procedure Laterality Date  . Hip surgery      SOCIAL HISTORY:   History  Substance Use Topics  . Smoking status: Current Every Day Smoker -- 0.25 packs/day for 40 years    Types: Cigarettes  . Smokeless tobacco: Not on file  . Alcohol Use: 12.0 oz/week    20 Cans of beer per week    FAMILY HISTORY:   Family History  Problem Relation Age of Onset  . Hypertension Mother   . Cirrhosis Father     DRUG ALLERGIES:   Allergies  Allergen Reactions  . Penicillins Hives and Itching    REVIEW OF SYSTEMS:    Review of Systems  Constitutional: Negative for fever and weight loss.  HENT: Negative for congestion, nosebleeds and tinnitus.   Eyes: Negative for blurred vision, double vision and redness.  Respiratory: Negative for cough, hemoptysis and shortness of breath.   Cardiovascular: Negative for chest pain, orthopnea, leg swelling and PND.  Gastrointestinal: Negative for nausea, vomiting, abdominal pain, diarrhea and melena.  Genitourinary: Negative for dysuria, urgency and hematuria.  Musculoskeletal: Positive for joint pain (left hip pain). Negative for falls.  Neurological: Positive for weakness. Negative for dizziness, tingling, sensory change, focal weakness, seizures and headaches.  Endo/Heme/Allergies: Negative for polydipsia. Does not bruise/bleed easily.  Psychiatric/Behavioral: Negative for depression and memory loss. The patient is not nervous/anxious.     MEDICATIONS AT HOME:   Prior to Admission medications   Medication Sig Start Date End Date Taking? Authorizing Provider  albuterol (PROVENTIL HFA;VENTOLIN HFA) 108 (90 BASE) MCG/ACT inhaler Inhale 2 puffs into the lungs every 4 (four) hours as needed for wheezing or shortness of breath.   Yes Historical Provider, MD  alendronate (FOSAMAX) 70 MG tablet Take 70 mg by mouth once a week. Pt takes on Tuesday.   Yes Historical Provider, MD  aspirin 81 MG chewable tablet Chew 81 mg by mouth daily.   Yes Historical Provider, MD  carisoprodol (SOMA) 350 MG tablet Take 350 mg  by mouth 3 (three) times daily as needed for muscle spasms.   Yes Historical Provider, MD  diltiazem (CARDIZEM) 60 MG tablet Take 60 mg by mouth 2 (two) times daily.   Yes Historical Provider, MD  mirtazapine (REMERON) 7.5 MG tablet Take 7.5 mg by mouth at bedtime.   Yes Historical Provider, MD  oxyCODONE-acetaminophen (PERCOCET/ROXICET) 5-325 MG per tablet Take 0.5-1 tablets by mouth 3 (three) times daily as needed for severe pain.    Yes Historical Provider, MD   potassium chloride (K-DUR) 10 MEQ tablet Take 10 mEq by mouth daily.   Yes Historical Provider, MD  predniSONE (DELTASONE) 20 MG tablet Take 20 mg by mouth daily.   Yes Historical Provider, MD  tiotropium (SPIRIVA) 18 MCG inhalation capsule Place 18 mcg into inhaler and inhale daily.   Yes Historical Provider, MD  torsemide (DEMADEX) 5 MG tablet Take 5 mg by mouth daily.   Yes Historical Provider, MD  traMADol (ULTRAM) 50 MG tablet Take 50 mg by mouth every 8 (eight) hours as needed for moderate pain.   Yes Historical Provider, MD  traZODone (DESYREL) 100 MG tablet Take 100 mg by mouth at bedtime.   Yes Historical Provider, MD      VITAL SIGNS:  Blood pressure 203/121, pulse 103, temperature 98.1 F (36.7 C), temperature source Oral, resp. rate 18, height 4' (1.219 m), weight 30.845 kg (68 lb), SpO2 100 %.  PHYSICAL EXAMINATION:  Physical Exam  GENERAL:  59 y.o.-year-old cachectic patient lying in the bed with no acute distress.  EYES: Pupils equal, round, reactive to light and accommodation. No scleral icterus. Extraocular muscles intact.  HEENT: Head atraumatic, normocephalic. Oropharynx and nasopharynx clear. No oropharyngeal erythema, moist oral mucosa  NECK:  Supple, no jugular venous distention. No thyroid enlargement, no tenderness.  LUNGS: Normal breath sounds bilaterally, no wheezing, rales, rhonchi. No use of accessory muscles of respiration.  CARDIOVASCULAR: S1, S2 RRR. No murmurs, rubs, gallops, clicks.  ABDOMEN: Soft, nontender, nondistended. Bowel sounds present. No organomegaly or mass.  EXTREMITIES: No pedal edema, cyanosis, or clubbing. + 2 pedal & radial pulses b/l.  Lower extremity is externally rotated and shortened due to the fracture. NEUROLOGIC: Cranial nerves II through XII are intact. No focal Motor or sensory deficits appreciated b/l.  Globally weak PSYCHIATRIC: The patient is alert and oriented x 3. Good affect.  SKIN: No obvious rash, lesion, or ulcer.    LABORATORY PANEL:   CBC  Recent Labs Lab 11/29/14 1815  WBC 7.2  HGB 12.6  HCT 35.7  PLT 434   ------------------------------------------------------------------------------------------------------------------  Chemistries   Recent Labs Lab 11/29/14 1815  NA 116*  K 3.8  CL 73*  CO2 29  GLUCOSE 73  BUN <5*  CREATININE 0.36*  CALCIUM 8.4*   ------------------------------------------------------------------------------------------------------------------  Cardiac Enzymes  Recent Labs Lab 11/29/14 1815  TROPONINI <0.03   ------------------------------------------------------------------------------------------------------------------  RADIOLOGY:  Dg Chest 1 View  11/29/2014   CLINICAL DATA:  Pt fell from standing today onto kitchen floor. Injuring left side. pts c/o left hip pain, left leg is externally rotated. No previous surgeries.  EXAM: CHEST  1 VIEW  COMPARISON:  Chest CT, 11/04/2014  FINDINGS: Right infrahilar mass is noted as better defined on the recent CT. There is no lung consolidation or edema. No pleural effusion or pneumothorax.  Cardiac silhouette is normal in size. No mediastinal masses or convincing adenopathy. Left hilum is unremarkable. New  Bones are demineralized. Compression fractures are noted of the thoracic spine as were  present on the prior CT.  IMPRESSION: 1. No acute cardiopulmonary disease. 2. Right lower lobe mass. Please refer to the recent prior CT scan of the chest report. 3. Multiple thoracic compression fractures.   Electronically Signed   By: Lajean Manes M.D.   On: 11/29/2014 18:26   Ct Hip Left Wo Contrast  11/29/2014   EXAM: CT OF THE LEFT HIP WITHOUT CONTRAST  TECHNIQUE: Multidetector CT imaging of the left hip was performed according to the standard protocol. Multiplanar CT image reconstructions were also generated.  COMPARISON:  Current left hip radiograph showing a left proximal femur fracture.  FINDINGS: As noted on standard  radiographs, there is a displaced comminuted intertrochanteric fracture of the proximal femur. Primary fracture line extends from the medial base of the femoral neck through the greater trochanter. There are additional fracture fragments from the lesser and greater trochanters. Major fracture fragments displaced, with the base of the femoral neck displacing lateral to the shaft component by 15 mm. Fracture displacement is also anterior measuring approximate 12 mm. This leads to mild apex anterior angulation. There is also mild varus angulation. The major fracture components are impacted, again by approximate 1.5 cm.  There are no other fractures. The left hip joint is normally aligned.  There is a left hip joint effusion consistent with hemarthrosis, small to moderate in size.  Surrounding muscular structures are unremarkable.  Within the pelvis, Foley catheter partly decompresses the bladder. There are vascular calcifications. No masses or adenopathy. No ascites or abnormal fluid collections.  IMPRESSION: 1. Mildly displaced, impacted and comminuted intratrochanteric fracture of left proximal femur as detailed above.   Electronically Signed   By: Lajean Manes M.D.   On: 11/29/2014 19:50   Chest Portable 1 View  11/29/2014   CLINICAL DATA:  Hip fracture.  Preop study.  EXAM: PORTABLE CHEST - 1 VIEW  COMPARISON:  11/29/2014 at 1803 hours  FINDINGS: No change from the earlier exam. There is an infrahilar mass is noted previously, better evaluated on the chest CT dated 11/04/2014.  There is no lung consolidation or edema. No pleural effusion or pneumothorax.  Cardiac silhouette is normal in size. No mediastinal mass or convincing adenopathy.  Bony thorax is demineralized. There are old healed rib fractures on right.  IMPRESSION: No acute cardiopulmonary disease.  Right lower lobe, infrahilar mass.   Electronically Signed   By: Lajean Manes M.D.   On: 11/29/2014 19:44   Dg Hip Unilat With Pelvis 2-3 Views  Left  11/29/2014   CLINICAL DATA:  Hip pain after falling today.  Initial encounter.  EXAM: DG HIP (WITH OR WITHOUT PELVIS) 2-3V LEFT  COMPARISON:  PET-CT 11/14/2014.  FINDINGS: The bones are diffusely demineralized. There is an acute comminuted and mildly angulated intertrochanteric femur fracture on the left. Old fracture of the left inferior pubic ramus appears unchanged from prior CT. Patient is status post right femoral neck dynamic screw and intramedullary rod fixation. No evidence of dislocation. Mild sacroiliac and lower lumbar spine degenerative changes are present. There are diffuse vascular calcifications.  IMPRESSION: Acute, comminuted and angulated intertrochanteric left femur fracture.   Electronically Signed   By: Richardean Sale M.D.   On: 11/29/2014 18:25     IMPRESSION AND PLAN:   59 year old female with past medical history of COPD with ongoing tobacco abuse, alcohol abuse, osteoarthritis/osteopenia, hypertension, history of right hip fracture, chronic pain who presents to the hospital after a fall and noted to have a left hip fracture.  #  1 preoperative medical evaluation-patient is currently a moderate risk for noncardiac surgery. -Patient currently is hyponatremic and therefore not stable for OR.  -EKG has been reviewed which shows sinus tachycardia with no acute ST or T-wave changes. -We'll correct her sodium and stabilize her medically for OR next 1-2 days.  #2 left hip fracture-this is likely result of mechanical fall. -I will get orthopedic consult. Continue pain control with when necessary morphine and care as per orthopedics.  #3 hyponatremia-this is euvolemic hyponatremia. 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 -I will check her urine and serum osmolality. Check a urine sodium level. Consider nephrology consult. -Gentle IV fluid hydration and follow sodium.  #4 alcohol abuse-patient is at high risk for alcohol withdrawal. I'll place on CIWA  protocol.  #5 hypertension-continue Cardizem.  #6 COPD-without acute exacerbation. Continue Spiriva. Continue albuterol inhaler as needed. Next  #7 lung mass-likely lung cancer. Patient's PET scan was positive. Patient is being followed by Dr. Raul Del. -Patient has not initiated any treatment and likely currently is not stable for bronchoscopy or biopsy presently.    All the records are reviewed and case discussed with ED provider. Management plans discussed with the patient, family and they are in agreement.  CODE STATUS: Full  TOTAL TIME TAKING CARE OF THIS PATIENT: 50 minutes.    Henreitta Leber M.D on 11/29/2014 at 8:25 PM  Between 7am to 6pm - Pager - [phone]  After 6pm go to [email or web address] - password [email or web address] [facility]  Office  [phone]  CC: Primary care physician; Kirk Ruths., MD

## 2014-11-29 NOTE — ED Notes (Signed)
Lab called in critical Na 116

## 2014-11-29 NOTE — ED Notes (Signed)
Pt states she was washing her hair in the sink and had a towel on and fell pt states she was unable to get up from floor. Per pt she only had 2 beers, pts states she wants pain medication "last time the ems gave it to me on the way in" pt complains of hip pain at this time states she is unable to move due to pain

## 2014-11-29 NOTE — ED Provider Notes (Signed)
[facility] Emergency Department Provider Note   ____________________________________________  Time seen: 6:30 PM I have reviewed the triage vital signs and the triage nursing note.  HISTORY  Chief Complaint Hip Pain   Historian Patient and her mother  HPI Rachael Palmer is a 59 y.o. female who has chronic pain and was having her hair done by her mother and she came off balance getting up from the sink with a hair towel wrapped around her head and she landed on her left hip. She is having severe left hip pain. This happened just prior to arrival. She has had her right hip replaced within the past few years with Dr. Rudene Christians, however she does not want to see Dr. Rudene Christians.No head injury, no neck, back, abdominal, or chest pain or trauma. She has chronic shortness of breath and cough.   Past Medical History  Diagnosis Date  . Asthma   . Hypertension     There are no active problems to display for this patient.   Past Surgical History  Procedure Laterality Date  . Hip surgery      Current Outpatient Rx  Name  Route  Sig  Dispense  Refill  . albuterol (PROVENTIL HFA;VENTOLIN HFA) 108 (90 BASE) MCG/ACT inhaler   Inhalation   Inhale 2 puffs into the lungs every 4 (four) hours as needed for wheezing or shortness of breath.         Marland Kitchen alendronate (FOSAMAX) 70 MG tablet   Oral   Take 70 mg by mouth once a week. Pt takes on Tuesday.         Marland Kitchen aspirin 81 MG chewable tablet   Oral   Chew 81 mg by mouth daily.         . carisoprodol (SOMA) 350 MG tablet   Oral   Take 350 mg by mouth 3 (three) times daily as needed for muscle spasms.         Marland Kitchen diltiazem (CARDIZEM) 60 MG tablet   Oral   Take 60 mg by mouth 2 (two) times daily.         . mirtazapine (REMERON) 7.5 MG tablet   Oral   Take 7.5 mg by mouth at bedtime.         Marland Kitchen oxyCODONE-acetaminophen (PERCOCET/ROXICET) 5-325 MG per tablet   Oral   Take 0.5-1 tablets by mouth 3 (three) times daily  as needed for severe pain.          . potassium chloride (K-DUR) 10 MEQ tablet   Oral   Take 10 mEq by mouth daily.         . predniSONE (DELTASONE) 20 MG tablet   Oral   Take 20 mg by mouth daily.         Marland Kitchen tiotropium (SPIRIVA) 18 MCG inhalation capsule   Inhalation   Place 18 mcg into inhaler and inhale daily.         Marland Kitchen torsemide (DEMADEX) 5 MG tablet   Oral   Take 5 mg by mouth daily.         . traMADol (ULTRAM) 50 MG tablet   Oral   Take 50 mg by mouth every 8 (eight) hours as needed for moderate pain.         . traZODone (DESYREL) 100 MG tablet   Oral   Take 100 mg by mouth at bedtime.           Allergies Penicillins  History reviewed. No pertinent family history.  Social History History  Substance Use Topics  . Smoking status: Current Every Day Smoker -- 0.25 packs/day    Types: Cigarettes  . Smokeless tobacco: Not on file  . Alcohol Use: 12.0 oz/week    20 Cans of beer per week    Review of Systems  Constitutional: Negative for fever. Eyes: Negative for visual changes. ENT: Negative for sore throat. Cardiovascular: Negative for chest pain. Respiratory: Chronic cough and shortness of breath. Gastrointestinal: Negative for abdominal pain, vomiting and diarrhea. Genitourinary: Negative for dysuria. Musculoskeletal: Chronic back pain Skin: Negative for rash. Neurological: Negative for headaches, focal weakness or numbness. 10 point Review of Systems otherwise negative ____________________________________________   PHYSICAL EXAM:  VITAL SIGNS: ED Triage Vitals  Enc Vitals Group     BP 11/29/14 1737 203/121 mmHg     Pulse Rate 11/29/14 1737 103     Resp 11/29/14 1737 18     Temp 11/29/14 1737 98.1 F (36.7 C)     Temp Source 11/29/14 1737 Oral     SpO2 11/29/14 1737 100 %     Weight 11/29/14 1737 68 lb (30.845 kg)     Height 11/29/14 1737 4' (1.219 m)     Head Cir --      Peak Flow --      Pain Score 11/29/14 1738 10     Pain  Loc --      Pain Edu? --      Excl. in [place]? --      Constitutional: Alert and oriented. Well appearing and in no distress. Eyes: Conjunctivae are normal. PERRL. Normal extraocular movements. ENT   Head: Normocephalic and atraumatic.   Nose: No congestion/rhinnorhea.   Mouth/Throat: Mucous membranes are moist.   Neck: No stridor. Cardiovascular/Chest: Normal rate, regular rhythm.  No murmurs, rubs, or gallops. Respiratory: Normal respiratory effort without tachypnea nor retractions. Breath sounds are clear and equal bilaterally. No wheezes/rales/rhonchi. Gastrointestinal: Soft. No distention, no guarding, no rebound. Nontender   Genitourinary/rectal:Deferred Musculoskeletal: Left leg mildly shortened. Pain at the left hip. Neurovascularly intact bilateral lower extremities. Upper extremity is atraumatic. Neurologic:  Normal speech and language. No gross or focal neurologic deficits are appreciated. Skin:  Skin is warm, dry and intact. No rash noted. Psychiatric: Mood and affect are normal. Speech and behavior are normal. Patient exhibits appropriate insight and judgment.  ____________________________________________   EKG I, Lisa Roca, MD, the attending physician have personally viewed and interpreted all ECGs.  110 bpm. Sinus tachycardia. Narrow QRS. Normal axis. Normal ST and T-wave ____________________________________________  LABS (pertinent positives/negatives)  Sodium 116, chloride 73, other elect lites were without significant abnormality White blood count 7.2, hemoglobin 12.6 Ethanol pending, troponin pending  ____________________________________________  RADIOLOGY All Xrays were viewed by me. Imaging interpreted by Radiologist.  Hip left with pelvis:  IMPRESSION: Acute, comminuted and angulated intertrochanteric left femur Fracture.  Chest x-ray 1 view: No acute findings. Known right lower lobe mass and known multiple thoracic compression  fractures __________________________________________  PROCEDURES  Procedure(s) performed: None Critical Care performed: None  ____________________________________________   ED COURSE / ASSESSMENT AND PLAN  CONSULTATIONS: Phone consultation with orthopedic surgeon Dr. Mack Guise  Pertinent labs & imaging results that were available during my care of the patient were reviewed by me and considered in my medical decision making (see chart for details).  Patient was found to have a left intertrochanteric hip fracture, she is neurovascularly intact. No other trauma or injuries.  Laboratory evaluation revealed hyponatremia at 116. Patient appears to  be a symptomatic from this. She is an alcoholic, and may be cirrhotic. Most recent sodium was from 2014 when it was mildly low. Given that she is a symptomatic, a normal saline bolus was initiated. Will discuss with hospitalist further management for hyponatremia. Patient will be monitored closely for any symptoms related to her hyponatremia.  Patient / Family / Caregiver informed of clinical course, medical decision-making process, and agree with plan.    ___________________________________________   FINAL CLINICAL IMPRESSION(S) / ED DIAGNOSES   Final diagnoses:  Intertrochanteric fracture, closed, left, initial encounter  Hyponatremia      Lisa Roca, MD 11/29/14 [phone]

## 2014-11-30 DIAGNOSIS — E43 Unspecified severe protein-calorie malnutrition: Secondary | ICD-10-CM | POA: Insufficient documentation

## 2014-11-30 LAB — CBC
HCT: 32.8 % — ABNORMAL LOW (ref 35.0–47.0)
Hemoglobin: 11.9 g/dL — ABNORMAL LOW (ref 12.0–16.0)
MCH: 37.3 pg — AB (ref 26.0–34.0)
MCHC: 36.3 g/dL — AB (ref 32.0–36.0)
MCV: 102.8 fL — AB (ref 80.0–100.0)
PLATELETS: 365 10*3/uL (ref 150–440)
RBC: 3.19 MIL/uL — AB (ref 3.80–5.20)
RDW: 12.8 % (ref 11.5–14.5)
WBC: 5.5 10*3/uL (ref 3.6–11.0)

## 2014-11-30 LAB — BASIC METABOLIC PANEL
Anion gap: 9 (ref 5–15)
Anion gap: 15 (ref 5–15)
BUN: 8 mg/dL (ref 6–20)
CALCIUM: 8.1 mg/dL — AB (ref 8.9–10.3)
CALCIUM: 8.6 mg/dL — AB (ref 8.9–10.3)
CO2: 28 mmol/L (ref 22–32)
CO2: 23 mmol/L (ref 22–32)
CREATININE: 0.41 mg/dL — AB (ref 0.44–1.00)
CREATININE: 0.6 mg/dL (ref 0.44–1.00)
Chloride: 87 mmol/L — ABNORMAL LOW (ref 101–111)
Chloride: 87 mmol/L — ABNORMAL LOW (ref 101–111)
GFR calc Af Amer: >60 mL/min (ref >60)
GFR calc Af Amer: >60 mL/min (ref >60)
GFR calc non Af Amer: >60 mL/min (ref >60)
GLUCOSE: 110 mg/dL — AB (ref 65–99)
Glucose, Bld: 68 mg/dL (ref 65–99)
Potassium: 3.5 mmol/L (ref 3.5–5.1)
Potassium: 4.1 mmol/L (ref 3.5–5.1)
SODIUM: 125 mmol/L — AB (ref 135–145)
Sodium: 124 mmol/L — ABNORMAL LOW (ref 135–145)

## 2014-11-30 LAB — SURGICAL PCR SCREEN
MRSA, PCR: NEGATIVE
Staphylococcus aureus: NEGATIVE

## 2014-11-30 LAB — OSMOLALITY, URINE: OSMOLALITY UR: 182 mosm/kg — AB (ref 300–900)

## 2014-11-30 LAB — OSMOLALITY: Osmolality: 261 mOsm/kg — ABNORMAL LOW (ref 275–300)

## 2014-11-30 LAB — SODIUM, URINE, RANDOM: SODIUM UR: 44 mmol/L

## 2014-11-30 MED ORDER — ENSURE ENLIVE PO LIQD
237.0000 mL | Freq: Two times a day (BID) | ORAL | Status: DC
  Administered 2014-11-30 – 2014-12-03 (×4): 237 mL via ORAL

## 2014-11-30 MED ORDER — LABETALOL HCL 5 MG/ML IV SOLN
10.0000 mg | INTRAVENOUS | Status: DC | PRN
  Administered 2014-11-30: 10 mg via INTRAVENOUS
  Filled 2014-11-30 (×2): qty 4

## 2014-11-30 NOTE — Consult Note (Signed)
Date: 11/30/2014                  Patient Name:  Rachael Palmer  MRN: [id number]  DOB: 1955-12-24  Age / Sex: 59 y.o., female         PCP: Kirk Ruths., MD                 Service Requesting Consult: Internal Medicine                 Reason for Consult: Hyponatremia            History of Present Illness: Patient is a 59 y.o. female with medical problems of HTN, Emphysema, Lung mass, who was admitted to [facility] on 11/29/2014 for evaluation of Hip pain post fall.  Patient is diagnosed with a displaced intertrochanteric left hip fracture. During her workup in the ER, she was noted to have significant hyponatremia.  Her alcohol level was also significantly elevated.  Patient reports she smokes less than one pack per day.  She drinks about 6 cans of beer on a daily basis.  She was treated with IV fluids.  Her sodium level has improved to 124 this morning. Serum osmolality 261.  Urine osmolality was at 182 Her mother and aunt are at bedside   Medications: Outpatient medications: Prescriptions prior to admission  Medication Sig Dispense Refill Last Dose  . albuterol (PROVENTIL HFA;VENTOLIN HFA) 108 (90 BASE) MCG/ACT inhaler Inhale 2 puffs into the lungs every 4 (four) hours as needed for wheezing or shortness of breath.   11/29/2014 at Unknown time  . alendronate (FOSAMAX) 70 MG tablet Take 70 mg by mouth once a week. Pt takes on Tuesday.   11/26/2014 at unknown  . aspirin 81 MG chewable tablet Chew 81 mg by mouth daily.   11/28/2014 at Unknown time  . carisoprodol (SOMA) 350 MG tablet Take 350 mg by mouth 3 (three) times daily as needed for muscle spasms.   11/29/2014 at Unknown time  . diltiazem (CARDIZEM) 60 MG tablet Take 60 mg by mouth 2 (two) times daily.   11/29/2014 at Unknown time  . mirtazapine (REMERON) 7.5 MG tablet Take 7.5 mg by mouth at bedtime.   11/28/2014 at Unknown time  . oxyCODONE-acetaminophen (PERCOCET/ROXICET) 5-325 MG per tablet Take 0.5-1 tablets by mouth 3 (three) times  daily as needed for severe pain.    11/29/2014 at 1200  . potassium chloride (K-DUR) 10 MEQ tablet Take 10 mEq by mouth daily.   11/29/2014 at Unknown time  . predniSONE (DELTASONE) 20 MG tablet Take 20 mg by mouth daily.   11/29/2014 at Unknown time  . tiotropium (SPIRIVA) 18 MCG inhalation capsule Place 18 mcg into inhaler and inhale daily.   11/29/2014 at Unknown time  . torsemide (DEMADEX) 5 MG tablet Take 5 mg by mouth daily.   11/29/2014 at Unknown time  . traMADol (ULTRAM) 50 MG tablet Take 50 mg by mouth every 8 (eight) hours as needed for moderate pain.   11/29/2014 at 1200  . traZODone (DESYREL) 100 MG tablet Take 100 mg by mouth at bedtime.   11/28/2014 at Unknown time    Current medications: Current Facility-Administered Medications  Medication Dose Route Frequency Provider Last Rate Last Dose  . 0.9 %  sodium chloride infusion   Intravenous Continuous Henreitta Leber, MD 50 mL/hr at 11/29/14 2300    . acetaminophen (TYLENOL) tablet 650 mg  650 mg Oral Q6H PRN Henreitta Leber, MD  Or  . acetaminophen (TYLENOL) suppository 650 mg  650 mg Rectal Q6H PRN Henreitta Leber, MD      . albuterol (PROVENTIL) (2.5 MG/3ML) 0.083% nebulizer solution 3 mL  3 mL Inhalation Q4H PRN Henreitta Leber, MD      . aspirin chewable tablet 81 mg  81 mg Oral Daily Henreitta Leber, MD   81 mg at 11/30/14 1000  . diltiazem (CARDIZEM) tablet 60 mg  60 mg Oral BID Henreitta Leber, MD   60 mg at 11/30/14 1045  . folic acid (FOLVITE) tablet 1 mg  1 mg Oral Daily Henreitta Leber, MD   1 mg at 11/30/14 1044  . labetalol (NORMODYNE,TRANDATE) injection 10 mg  10 mg Intravenous Q2H PRN Lance Coon, MD   10 mg at 11/30/14 0234  . LORazepam (ATIVAN) tablet 1 mg  1 mg Oral Q6H PRN Henreitta Leber, MD       Or  . LORazepam (ATIVAN) injection 1 mg  1 mg Intravenous Q6H PRN Henreitta Leber, MD   1 mg at 11/29/14 2135  . mirtazapine (REMERON) tablet 7.5 mg  7.5 mg Oral QHS Henreitta Leber, MD   7.5 mg at 11/29/14 2258  .  morphine 2 MG/ML injection 1 mg  1 mg Intravenous Q4H PRN Henreitta Leber, MD   1 mg at 11/29/14 2345  . multivitamin with minerals tablet 1 tablet  1 tablet Oral Daily Henreitta Leber, MD   1 tablet at 11/30/14 1044  . ondansetron (ZOFRAN) tablet 4 mg  4 mg Oral Q6H PRN Henreitta Leber, MD       Or  . ondansetron [facility]) injection 4 mg  4 mg Intravenous Q6H PRN Henreitta Leber, MD      . oxyCODONE (Oxy IR/ROXICODONE) immediate release tablet 5-10 mg  5-10 mg Oral Q3H PRN Thornton Park, MD   10 mg at 11/29/14 1947  . potassium chloride (K-DUR) CR tablet 10 mEq  10 mEq Oral Daily Henreitta Leber, MD   10 mEq at 11/30/14 1045  . predniSONE (DELTASONE) tablet 20 mg  20 mg Oral Daily Henreitta Leber, MD   20 mg at 11/30/14 1045  . thiamine (VITAMIN B-1) tablet 100 mg  100 mg Oral Daily Henreitta Leber, MD   100 mg at 11/30/14 1045   Or  . thiamine (B-1) injection 100 mg  100 mg Intravenous Daily Henreitta Leber, MD   100 mg at 11/29/14 2125  . tiotropium (SPIRIVA) inhalation capsule 18 mcg  18 mcg Inhalation Daily Henreitta Leber, MD   18 mcg at 11/30/14 0801  . torsemide (DEMADEX) tablet 5 mg  5 mg Oral Daily Henreitta Leber, MD   5 mg at 11/30/14 1045  . traZODone (DESYREL) tablet 100 mg  100 mg Oral QHS Henreitta Leber, MD   100 mg at 11/29/14 2258      Allergies: Allergies  Allergen Reactions  . Penicillins Hives and Itching      Past Medical History: Past Medical History  Diagnosis Date  . Asthma   . Hypertension   . Lung mass   . COPD (chronic obstructive pulmonary disease)   . Alcohol abuse      Past Surgical History: Past Surgical History  Procedure Laterality Date  . Hip surgery       Family History: Family History  Problem Relation Age of Onset  . Hypertension Mother   . Cirrhosis Father  Social History: History   Social History  . Marital Status: Single    Spouse Name: N/A  . Number of Children: N/A  . Years of Education: N/A   Occupational  History  . Not on file.   Social History Main Topics  . Smoking status: Current Every Day Smoker -- 0.25 packs/day for 40 years    Types: Cigarettes  . Smokeless tobacco: Not on file  . Alcohol Use: 12.0 oz/week    20 Cans of beer per week  . Drug Use: No  . Sexual Activity: Not Currently   Other Topics Concern  . Not on file   Social History Narrative     Review of Systems: Gen: Denies any fever, chills, anorexia, weight loss, her normal weight was 110.  Over the last 10-12 years she has lost 30-40 pounds HEENT: No visual complaints,    CV: Denies chest pain, angina, . Resp: Denies dyspnea at rest, dyspnea with exercise, cough, sputum, wheezing, coughing up blood, and pleurisy. GI: Denies vomiting blood, jaundice, and melena. Denies dysphagia or odynophagia. GU : Denies urinary burning, blood in urine, urinary frequency, urinary hesitancy,  MS: Left hip pain. Derm: Denies rash, itching, unhealing ulcers.  Psych: Denies depression, anxiety  Heme: Denies bruising, bleeding, and enlarged lymph nodes. Neuro:  Normally able to drive.    Vital Signs: Blood pressure 126/88, pulse 106, temperature 98.6 F (37 C), temperature source Oral, resp. rate 18, height 4' (1.219 m), weight 30.845 kg (68 lb), SpO2 93 %.  Weight trends: Filed Weights   11/29/14 1737  Weight: 30.845 kg (68 lb)    Physical Exam: General:  cachectic woman, laying in bed, appears older than stated age  89 Anicteric, moist mucous membranes  Neck:  supple,  Lungs: Scattered rhonchi, normal respiratory effort  Heart: tachycardic, regular, no rub or gallop  Abdomen: Soft, nontender, nondistended  Extremities:  no peripheral edema  Neurologic: Sleepy but arousable and able to answer simple questions  Skin: No acute rashes             Lab results: Basic Metabolic Panel:  Recent Labs Lab 11/29/14 1815 11/30/14 0350  NA 116* 124*  K 3.8 3.5  CL 73* 87*  CO2 29 28  GLUCOSE 73 68  BUN <5*  <5*  CREATININE 0.36* 0.41*  CALCIUM 8.4* 8.1*    Liver Function Tests:  Recent Labs Lab 11/29/14 1949  ALBUMIN 3.8   No results for input(s): LIPASE, AMYLASE in the last 168 hours. No results for input(s): AMMONIA in the last 168 hours.  CBC:  Recent Labs Lab 11/29/14 1815 11/30/14 0350  WBC 7.2 5.5  HGB 12.6 11.9*  HCT 35.7 32.8*  MCV 101.6* 102.8*  PLT 434 365    Cardiac Enzymes:  Recent Labs Lab 11/29/14 1815  TROPONINI <0.03    BNP: Invalid input(s): POCBNP  CBG: No results for input(s): GLUCAP in the last 168 hours.  Microbiology: Results for orders placed or performed during the hospital encounter of 11/29/14  Urine culture     Status: None (Preliminary result)   Collection Time: 11/29/14  7:49 PM  Result Value Ref Range Status   Specimen Description URINE, RANDOM  Final   Special Requests NONE  Final   Culture NO GROWTH < 12 HOURS  Final   Report Status PENDING  Incomplete  Surgical pcr screen     Status: None   Collection Time: 11/30/14  6:30 AM  Result Value Ref Range Status  MRSA, PCR NEGATIVE NEGATIVE Final   Staphylococcus aureus NEGATIVE NEGATIVE Final    Comment:        The Xpert SA Assay (FDA approved for NASAL specimens in patients over 12 years of age), is one component of a comprehensive surveillance program.  Test performance has been validated by [facility] for patients greater than or equal to 41 year old. It is not intended to diagnose infection nor to guide or monitor treatment.     Coagulation Studies:  Recent Labs  11/29/14 1949  LABPROT 13.1  INR 0.97    Urinalysis: No results for input(s): COLORURINE, LABSPEC, PHURINE, GLUCOSEU, HGBUR, BILIRUBINUR, KETONESUR, PROTEINUR, UROBILINOGEN, NITRITE, LEUKOCYTESUR in the last 72 hours.  Invalid input(s): APPERANCEUR    Imaging: Dg Chest 1 View  11/29/2014   CLINICAL DATA:  Pt fell from standing today onto kitchen floor. Injuring left side. pts c/o left hip  pain, left leg is externally rotated. No previous surgeries.  EXAM: CHEST  1 VIEW  COMPARISON:  Chest CT, 11/04/2014  FINDINGS: Right infrahilar mass is noted as better defined on the recent CT. There is no lung consolidation or edema. No pleural effusion or pneumothorax.  Cardiac silhouette is normal in size. No mediastinal masses or convincing adenopathy. Left hilum is unremarkable. New  Bones are demineralized. Compression fractures are noted of the thoracic spine as were present on the prior CT.  IMPRESSION: 1. No acute cardiopulmonary disease. 2. Right lower lobe mass. Please refer to the recent prior CT scan of the chest report. 3. Multiple thoracic compression fractures.   Electronically Signed   By: Lajean Manes M.D.   On: 11/29/2014 18:26   Ct Hip Left Wo Contrast  11/29/2014   EXAM: CT OF THE LEFT HIP WITHOUT CONTRAST  TECHNIQUE: Multidetector CT imaging of the left hip was performed according to the standard protocol. Multiplanar CT image reconstructions were also generated.  COMPARISON:  Current left hip radiograph showing a left proximal femur fracture.  FINDINGS: As noted on standard radiographs, there is a displaced comminuted intertrochanteric fracture of the proximal femur. Primary fracture line extends from the medial base of the femoral neck through the greater trochanter. There are additional fracture fragments from the lesser and greater trochanters. Major fracture fragments displaced, with the base of the femoral neck displacing lateral to the shaft component by 15 mm. Fracture displacement is also anterior measuring approximate 12 mm. This leads to mild apex anterior angulation. There is also mild varus angulation. The major fracture components are impacted, again by approximate 1.5 cm.  There are no other fractures. The left hip joint is normally aligned.  There is a left hip joint effusion consistent with hemarthrosis, small to moderate in size.  Surrounding muscular structures are  unremarkable.  Within the pelvis, Foley catheter partly decompresses the bladder. There are vascular calcifications. No masses or adenopathy. No ascites or abnormal fluid collections.  IMPRESSION: 1. Mildly displaced, impacted and comminuted intratrochanteric fracture of left proximal femur as detailed above.   Electronically Signed   By: Lajean Manes M.D.   On: 11/29/2014 19:50   Chest Portable 1 View  11/29/2014   CLINICAL DATA:  Hip fracture.  Preop study.  EXAM: PORTABLE CHEST - 1 VIEW  COMPARISON:  11/29/2014 at 1803 hours  FINDINGS: No change from the earlier exam. There is an infrahilar mass is noted previously, better evaluated on the chest CT dated 11/04/2014.  There is no lung consolidation or edema. No pleural effusion or pneumothorax.  Cardiac silhouette is normal in size. No mediastinal mass or convincing adenopathy.  Bony thorax is demineralized. There are old healed rib fractures on right.  IMPRESSION: No acute cardiopulmonary disease.  Right lower lobe, infrahilar mass.   Electronically Signed   By: Lajean Manes M.D.   On: 11/29/2014 19:44   Dg Hip Unilat With Pelvis 2-3 Views Left  11/29/2014   CLINICAL DATA:  Hip pain after falling today.  Initial encounter.  EXAM: DG HIP (WITH OR WITHOUT PELVIS) 2-3V LEFT  COMPARISON:  PET-CT 11/14/2014.  FINDINGS: The bones are diffusely demineralized. There is an acute comminuted and mildly angulated intertrochanteric femur fracture on the left. Old fracture of the left inferior pubic ramus appears unchanged from prior CT. Patient is status post right femoral neck dynamic screw and intramedullary rod fixation. No evidence of dislocation. Mild sacroiliac and lower lumbar spine degenerative changes are present. There are diffuse vascular calcifications.  IMPRESSION: Acute, comminuted and angulated intertrochanteric left femur fracture.   Electronically Signed   By: Richardean Sale M.D.   On: 11/29/2014 18:25      Assessment & Plan: Pt is a 59 y.o. yo  female with a PMHX of HTN, Emphysema, lung mass, was admitted to [facility] on 11/29/2014 with Left Hip fracture.  1. Hyponatremia.  Baseline sodium level is unknown.  Multiple risk factors including poor nutrition, alcohol abuse, potential for having liver cirrhosis, possible contribution from underlying lung disease emphysema, lung mass Urine osmolality is less than urine osmolality suggesting free water clearance Will continue to monitor.  Repeat labs this afternoon Currently getting IV saline at 50 cc per hour and torsemide 5 mg daily 2. Left hip fracture.  Surgery planned for tomorrow morning.

## 2014-11-30 NOTE — Consult Note (Signed)
ORTHOPAEDIC CONSULTATION  REQUESTING PHYSICIAN: Bettey Costa, MD  Chief Complaint: Left intertrochanteric hip fracture  HPI: Rachael Palmer is a 59 y.o. female who complains of  left hip pain status post fall at home yesterday. Patient states she was having her hair washed by her mother. She had retropatellar around her hand covering her eyes temporarily. She states this caused her to lose her balance and fall.  Patient has pain in the left hip. She was unable to bear weight following this injury. She was brought to the [facility] where x-rays revealed a displaced intertrochanteric left hip fracture. She had a previous intramedullary fixation for a right intertrochanteric hip fracture by Dr. Rudene Christians approximately 2 years ago according to the patient.  Patient's alcohol level was significantly elevated upon arrival to the ED. She was also found to be severely hyponatremic.  Past Medical History  Diagnosis Date  . Asthma   . Hypertension   . Lung mass   . COPD (chronic obstructive pulmonary disease)   . Alcohol abuse    Past Surgical History  Procedure Laterality Date  . Hip surgery     History   Social History  . Marital Status: Single    Spouse Name: N/A  . Number of Children: N/A  . Years of Education: N/A   Social History Main Topics  . Smoking status: Current Every Day Smoker -- 0.25 packs/day for 40 years    Types: Cigarettes  . Smokeless tobacco: Not on file  . Alcohol Use: 12.0 oz/week    20 Cans of beer per week  . Drug Use: No  . Sexual Activity: Not Currently   Other Topics Concern  . None   Social History Narrative   Family History  Problem Relation Age of Onset  . Hypertension Mother   . Cirrhosis Father    Allergies  Allergen Reactions  . Penicillins Hives and Itching   Prior to Admission medications   Medication Sig Start Date End Date Taking? Authorizing Provider  albuterol (PROVENTIL HFA;VENTOLIN HFA) 108 (90 BASE) MCG/ACT inhaler  Inhale 2 puffs into the lungs every 4 (four) hours as needed for wheezing or shortness of breath.   Yes Historical Provider, MD  alendronate (FOSAMAX) 70 MG tablet Take 70 mg by mouth once a week. Pt takes on Tuesday.   Yes Historical Provider, MD  aspirin 81 MG chewable tablet Chew 81 mg by mouth daily.   Yes Historical Provider, MD  carisoprodol (SOMA) 350 MG tablet Take 350 mg by mouth 3 (three) times daily as needed for muscle spasms.   Yes Historical Provider, MD  diltiazem (CARDIZEM) 60 MG tablet Take 60 mg by mouth 2 (two) times daily.   Yes Historical Provider, MD  mirtazapine (REMERON) 7.5 MG tablet Take 7.5 mg by mouth at bedtime.   Yes Historical Provider, MD  oxyCODONE-acetaminophen (PERCOCET/ROXICET) 5-325 MG per tablet Take 0.5-1 tablets by mouth 3 (three) times daily as needed for severe pain.    Yes Historical Provider, MD  potassium chloride (K-DUR) 10 MEQ tablet Take 10 mEq by mouth daily.   Yes Historical Provider, MD  predniSONE (DELTASONE) 20 MG tablet Take 20 mg by mouth daily.   Yes Historical Provider, MD  tiotropium (SPIRIVA) 18 MCG inhalation capsule Place 18 mcg into inhaler and inhale daily.   Yes Historical Provider, MD  torsemide (DEMADEX) 5 MG tablet Take 5 mg by mouth daily.   Yes Historical Provider, MD  traMADol (ULTRAM) 50 MG tablet Take 50  mg by mouth every 8 (eight) hours as needed for moderate pain.   Yes Historical Provider, MD  traZODone (DESYREL) 100 MG tablet Take 100 mg by mouth at bedtime.   Yes Historical Provider, MD   Dg Chest 1 View  11/29/2014   CLINICAL DATA:  Pt fell from standing today onto kitchen floor. Injuring left side. pts c/o left hip pain, left leg is externally rotated. No previous surgeries.  EXAM: CHEST  1 VIEW  COMPARISON:  Chest CT, 11/04/2014  FINDINGS: Right infrahilar mass is noted as better defined on the recent CT. There is no lung consolidation or edema. No pleural effusion or pneumothorax.  Cardiac silhouette is normal in size. No  mediastinal masses or convincing adenopathy. Left hilum is unremarkable. New  Bones are demineralized. Compression fractures are noted of the thoracic spine as were present on the prior CT.  IMPRESSION: 1. No acute cardiopulmonary disease. 2. Right lower lobe mass. Please refer to the recent prior CT scan of the chest report. 3. Multiple thoracic compression fractures.   Electronically Signed   By: Lajean Manes M.D.   On: 11/29/2014 18:26   Ct Hip Left Wo Contrast  11/29/2014   EXAM: CT OF THE LEFT HIP WITHOUT CONTRAST  TECHNIQUE: Multidetector CT imaging of the left hip was performed according to the standard protocol. Multiplanar CT image reconstructions were also generated.  COMPARISON:  Current left hip radiograph showing a left proximal femur fracture.  FINDINGS: As noted on standard radiographs, there is a displaced comminuted intertrochanteric fracture of the proximal femur. Primary fracture line extends from the medial base of the femoral neck through the greater trochanter. There are additional fracture fragments from the lesser and greater trochanters. Major fracture fragments displaced, with the base of the femoral neck displacing lateral to the shaft component by 15 mm. Fracture displacement is also anterior measuring approximate 12 mm. This leads to mild apex anterior angulation. There is also mild varus angulation. The major fracture components are impacted, again by approximate 1.5 cm.  There are no other fractures. The left hip joint is normally aligned.  There is a left hip joint effusion consistent with hemarthrosis, small to moderate in size.  Surrounding muscular structures are unremarkable.  Within the pelvis, Foley catheter partly decompresses the bladder. There are vascular calcifications. No masses or adenopathy. No ascites or abnormal fluid collections.  IMPRESSION: 1. Mildly displaced, impacted and comminuted intratrochanteric fracture of left proximal femur as detailed above.    Electronically Signed   By: Lajean Manes M.D.   On: 11/29/2014 19:50   Chest Portable 1 View  11/29/2014   CLINICAL DATA:  Hip fracture.  Preop study.  EXAM: PORTABLE CHEST - 1 VIEW  COMPARISON:  11/29/2014 at 1803 hours  FINDINGS: No change from the earlier exam. There is an infrahilar mass is noted previously, better evaluated on the chest CT dated 11/04/2014.  There is no lung consolidation or edema. No pleural effusion or pneumothorax.  Cardiac silhouette is normal in size. No mediastinal mass or convincing adenopathy.  Bony thorax is demineralized. There are old healed rib fractures on right.  IMPRESSION: No acute cardiopulmonary disease.  Right lower lobe, infrahilar mass.   Electronically Signed   By: Lajean Manes M.D.   On: 11/29/2014 19:44   Dg Hip Unilat With Pelvis 2-3 Views Left  11/29/2014   CLINICAL DATA:  Hip pain after falling today.  Initial encounter.  EXAM: DG HIP (WITH OR WITHOUT PELVIS) 2-3V LEFT  COMPARISON:  PET-CT 11/14/2014.  FINDINGS: The bones are diffusely demineralized. There is an acute comminuted and mildly angulated intertrochanteric femur fracture on the left. Old fracture of the left inferior pubic ramus appears unchanged from prior CT. Patient is status post right femoral neck dynamic screw and intramedullary rod fixation. No evidence of dislocation. Mild sacroiliac and lower lumbar spine degenerative changes are present. There are diffuse vascular calcifications.  IMPRESSION: Acute, comminuted and angulated intertrochanteric left femur fracture.   Electronically Signed   By: Richardean Sale M.D.   On: 11/29/2014 18:25    Positive ROS: All other systems have been reviewed and were otherwise negative with the exception of those mentioned in the HPI and as above.  Physical Exam: General: Alert, no acute distress  MUSCULOSKELETAL: Left lower extremity: Patient's skin is intact. There is no erythema or ecchymosis or swelling. Thigh compartments are soft and compressible  as are the lower leg compartments. The left lower extremity is shortened and externally rotated. She has palpable pedal pulses, intact sensation light touch and intact motor function in the left foot.  Assessment: Left intertrochanteric hip fracture  Plan: Patient has having her hyponatremia addressed by the hospitalist service. She has not yet been cleared for surgery. I am tentatively planning intramedullary fixation for left intertrochanteric hip fracture tomorrow morning. This will be pending medical clearance. She will remain on bed rest. A [place] traction will be ordered. Continue current pain management. Patient be nothing by mouth after midnight. I have discussed this case with Dr. Marlowe Alt, the anesthesiologist as well. I reviewed the patient's laboratory and radiograph studies in preparation for this case. I discussed this case with Dr. Benjie Karvonen as well.  He will continue working on the hyponatremia today and will determine if she is fit for surgery tomorrow.   Thornton Park, MD    11/30/2014 10:58 AM

## 2014-11-30 NOTE — Progress Notes (Signed)
[facility] [place] NAME: Rachael Palmer    MR#:  [id number]  DATE OF BIRTH:  07/08/1955  SUBJECTIVE:  Patient is complaining of pain in her left hip.  REVIEW OF SYSTEMS:    Review of Systems  Constitutional: Negative for fever, chills and malaise/fatigue.  HENT: Negative for sore throat.   Eyes: Negative for blurred vision.  Respiratory: Negative for cough, hemoptysis, shortness of breath and wheezing.   Cardiovascular: Negative for chest pain, palpitations and leg swelling.  Gastrointestinal: Negative for nausea, vomiting, abdominal pain, diarrhea and blood in stool.  Genitourinary: Negative for dysuria.  Musculoskeletal: Positive for falls. Negative for back pain.       Left hip pain  Neurological: Negative for dizziness, tremors and headaches.  Endo/Heme/Allergies: Does not bruise/bleed easily.   lethargic Tolerating Diet: Nothing by mouth      DRUG ALLERGIES:   Allergies  Allergen Reactions  . Penicillins Hives and Itching    VITALS:  Blood pressure 126/88, pulse 106, temperature 98.6 F (37 C), temperature source Oral, resp. rate 18, height 4' (1.219 m), weight 30.845 kg (68 lb), SpO2 93 %.  PHYSICAL EXAMINATION:   Physical Exam    LABORATORY PANEL:   CBC  Recent Labs Lab 11/30/14 0350  WBC 5.5  HGB 11.9*  HCT 32.8*  PLT 365   ------------------------------------------------------------------------------------------------------------------  Chemistries   Recent Labs Lab 11/30/14 0350  NA 124*  K 3.5  CL 87*  CO2 28  GLUCOSE 68  BUN <5*  CREATININE 0.41*  CALCIUM 8.1*   ------------------------------------------------------------------------------------------------------------------  Cardiac Enzymes  Recent Labs Lab 11/29/14 1815  TROPONINI <0.03   ------------------------------------------------------------------------------------------------------------------  RADIOLOGY:   Dg Chest 1 View  11/29/2014   CLINICAL DATA:  Pt fell from standing today onto kitchen floor. Injuring left side. pts c/o left hip pain, left leg is externally rotated. No previous surgeries.  EXAM: CHEST  1 VIEW  COMPARISON:  Chest CT, 11/04/2014  FINDINGS: Right infrahilar mass is noted as better defined on the recent CT. There is no lung consolidation or edema. No pleural effusion or pneumothorax.  Cardiac silhouette is normal in size. No mediastinal masses or convincing adenopathy. Left hilum is unremarkable. New  Bones are demineralized. Compression fractures are noted of the thoracic spine as were present on the prior CT.  IMPRESSION: 1. No acute cardiopulmonary disease. 2. Right lower lobe mass. Please refer to the recent prior CT scan of the chest report. 3. Multiple thoracic compression fractures.   Electronically Signed   By: Lajean Manes M.D.   On: 11/29/2014 18:26   Ct Hip Left Wo Contrast  11/29/2014   EXAM: CT OF THE LEFT HIP WITHOUT CONTRAST  TECHNIQUE: Multidetector CT imaging of the left hip was performed according to the standard protocol. Multiplanar CT image reconstructions were also generated.  COMPARISON:  Current left hip radiograph showing a left proximal femur fracture.  FINDINGS: As noted on standard radiographs, there is a displaced comminuted intertrochanteric fracture of the proximal femur. Primary fracture line extends from the medial base of the femoral neck through the greater trochanter. There are additional fracture fragments from the lesser and greater trochanters. Major fracture fragments displaced, with the base of the femoral neck displacing lateral to the shaft component by 15 mm. Fracture displacement is also anterior measuring approximate 12 mm. This leads to mild apex anterior angulation. There is also mild varus angulation. The major fracture components are impacted, again by approximate 1.5  cm.  There are no other fractures. The left hip joint is normally aligned.   There is a left hip joint effusion consistent with hemarthrosis, small to moderate in size.  Surrounding muscular structures are unremarkable.  Within the pelvis, Foley catheter partly decompresses the bladder. There are vascular calcifications. No masses or adenopathy. No ascites or abnormal fluid collections.  IMPRESSION: 1. Mildly displaced, impacted and comminuted intratrochanteric fracture of left proximal femur as detailed above.   Electronically Signed   By: Lajean Manes M.D.   On: 11/29/2014 19:50   Chest Portable 1 View  11/29/2014   CLINICAL DATA:  Hip fracture.  Preop study.  EXAM: PORTABLE CHEST - 1 VIEW  COMPARISON:  11/29/2014 at 1803 hours  FINDINGS: No change from the earlier exam. There is an infrahilar mass is noted previously, better evaluated on the chest CT dated 11/04/2014.  There is no lung consolidation or edema. No pleural effusion or pneumothorax.  Cardiac silhouette is normal in size. No mediastinal mass or convincing adenopathy.  Bony thorax is demineralized. There are old healed rib fractures on right.  IMPRESSION: No acute cardiopulmonary disease.  Right lower lobe, infrahilar mass.   Electronically Signed   By: Lajean Manes M.D.   On: 11/29/2014 19:44   Dg Hip Unilat With Pelvis 2-3 Views Left  11/29/2014   CLINICAL DATA:  Hip pain after falling today.  Initial encounter.  EXAM: DG HIP (WITH OR WITHOUT PELVIS) 2-3V LEFT  COMPARISON:  PET-CT 11/14/2014.  FINDINGS: The bones are diffusely demineralized. There is an acute comminuted and mildly angulated intertrochanteric femur fracture on the left. Old fracture of the left inferior pubic ramus appears unchanged from prior CT. Patient is status post right femoral neck dynamic screw and intramedullary rod fixation. No evidence of dislocation. Mild sacroiliac and lower lumbar spine degenerative changes are present. There are diffuse vascular calcifications.  IMPRESSION: Acute, comminuted and angulated intertrochanteric left femur  fracture.   Electronically Signed   By: Richardean Sale M.D.   On: 11/29/2014 18:25     ASSESSMENT AND PLAN:   59 year old female with past medical history of COPD, tobacco abuse, alcohol abuse, osteopenia who had a mechanical fall and subsequently has a left hip fracture.  1. Left hip fracture: This is due to mechanical fall. Orthopedics has been consulted for further evaluation and management. Patient is moderate risk for moderate procedure. Her sodium level is still low today and may require 1 more day of management before surgery.  2. Hyponatremia: I suspect this is in part due to hypovolemia as her sodium level did improve with IV fluids. She may have a component of SIADH as well given her history of lung mass. Nephrology consultation is pending. Continue to monitor sodium levels.  3. EtOH abuse: Patient is currently on CIWA protocol which I will continue.  4. Tobacco dependence: Patient was counseled for 3 minutes regarding tobacco dependence.  5. Essential hypertension: Continue Cardizem  6. COPD 2: No signs of acute exacerbation. Patient will continue Spiriva  7. Lung mass: Patient's PET scan was positive. Patient being followed by Dr. Raul Del.  Management plans discussed with the patient and family and they are in agreement.  CODE STATUS: Full  TOTAL TIME TAKING CARE OF THIS PATIENT: 30 minutes.     POSSIBLE D/C 3-4 days, DEPENDING ON CLINICAL CONDITION.   Jidenna Figgs M.D on 11/30/2014 at 10:57 AM  Between 7am to 6pm - Pager - [phone] After 6pm go to [email or web address] - password  [email or web address] [facility]  [facility]  Office  [phone]  CC: Primary care physician; Kirk Ruths., MD

## 2014-11-30 NOTE — Progress Notes (Signed)
SINUS TACH

## 2014-11-30 NOTE — Progress Notes (Signed)
Initial Nutrition Assessment  DOCUMENTATION CODES:   Severe malnutrition in context of chronic illness  INTERVENTION:  Coordination of care: Await diet progression Nutrition Supplement Therapy: Recommend nutrition supplement once diet progressed   NUTRITION DIAGNOSIS:   Inadequate oral intake related to acute illness as evidenced by NPO status.    GOAL:   Patient will meet greater than or equal to 90% of their needs    MONITOR:    (Energy intake, Anthropometric)  REASON FOR ASSESSMENT:   Malnutrition Screening Tool    ASSESSMENT:      Pt admitted with hip fracture s/p fall, with hyponatremia  Past Medical History  Diagnosis Date  . Asthma   . Hypertension   . Lung mass   . COPD (chronic obstructive pulmonary disease)   . Alcohol abuse     Current Nutrition: NPO  Food/Nutrition-Related History: Family at bedside reports poor po intake for "awhile" "months".  Reports eats breakfast cereal or ham biscuit, no lunch and dinner eats some meat and veggies   Medications: NS at 15VV/OH, folic acid, MVI, remeron, thiamin, KCL  Electrolyte/Renal Profile and Glucose Profile:   Recent Labs Lab 11/29/14 1815 11/30/14 0350  NA 116* 124*  K 3.8 3.5  CL 73* 87*  CO2 29 28  BUN <5* <5*  CREATININE 0.36* 0.41*  CALCIUM 8.4* 8.1*  GLUCOSE 73 68   Protein Profile:   Recent Labs Lab 11/29/14 1949  ALBUMIN 3.8    Last BM: 8/05   Nutrition-Focused Physical Exam Findings: Nutrition-Focused physical exam completed. Findings are normal to severe fat depletion, mild/moderate to severe muscle depletion, and no edema.    Weight Change: Family reports pt UBW of 110 pounds 13 years ago.  Has been losing wt since then.   Diet Order:  Diet NPO time specified  Skin:   reviewed      Height:   Ht Readings from Last 1 Encounters:  11/29/14 4' (1.219 m)    Weight:   Wt Readings from Last 1 Encounters:  11/29/14 68 lb (30.845 kg)        BMI:  Body  mass index is 20.76 kg/(m^2).  Estimated Nutritional Needs:   Kcal:  Using IBW of 36kg BEE 671 kcals (IF 1.1-1.3, AF 1.2) [phone] kcals/d.   Protein:  Using IBW of 36kg (1.2-1.4 g/kg) 43-50 g/d  Fluid:  Using IBW of 36kg (30-73m/kg) [phone]  EDUCATION NEEDS:   No education needs identified at this time  HIGH Care Level  Jp Eastham B. AlZenia ResidesRDEdgefieldLDOxfordpager)

## 2014-11-30 NOTE — Progress Notes (Signed)
HYPERTENSION 165/104 PULSE 118

## 2014-12-01 ENCOUNTER — Inpatient Hospital Stay: Payer: Medicare Other | Admitting: Anesthesiology

## 2014-12-01 ENCOUNTER — Inpatient Hospital Stay: Payer: Medicare Other

## 2014-12-01 ENCOUNTER — Encounter
Admission: EM | Disposition: A | Discharge status: Home Health | Payer: Self-pay | Source: Home / Self Care | Attending: Internal Medicine

## 2014-12-01 HISTORY — PX: FEMUR IM NAIL: SHX1597

## 2014-12-01 LAB — SODIUM: SODIUM: 129 mmol/L — AB (ref 135–145)

## 2014-12-01 LAB — CBC
HCT: 30.6 % — ABNORMAL LOW (ref 35.0–47.0)
Hemoglobin: 10.8 g/dL — ABNORMAL LOW (ref 12.0–16.0)
MCH: 36.8 pg — ABNORMAL HIGH (ref 26.0–34.0)
MCHC: 35.4 g/dL (ref 32.0–36.0)
MCV: 104 fL — ABNORMAL HIGH (ref 80.0–100.0)
Platelets: 367 10*3/uL (ref 150–440)
RBC: 2.94 MIL/uL — AB (ref 3.80–5.20)
RDW: 12.6 % (ref 11.5–14.5)
WBC: 8.5 10*3/uL (ref 3.6–11.0)

## 2014-12-01 LAB — BASIC METABOLIC PANEL
ANION GAP: 8 (ref 5–15)
ANION GAP: 10 (ref 5–15)
BUN: 16 mg/dL (ref 6–20)
BUN: 7 mg/dL (ref 6–20)
CHLORIDE: 92 mmol/L — AB (ref 101–111)
CO2: 28 mmol/L (ref 22–32)
CO2: 28 mmol/L (ref 22–32)
CREATININE: 0.49 mg/dL (ref 0.44–1.00)
Calcium: 8.8 mg/dL — ABNORMAL LOW (ref 8.9–10.3)
Calcium: 8.3 mg/dL — ABNORMAL LOW (ref 8.9–10.3)
Chloride: 94 mmol/L — ABNORMAL LOW (ref 101–111)
Creatinine, Ser: 0.55 mg/dL (ref 0.44–1.00)
GFR calc Af Amer: >60 mL/min (ref >60)
GFR calc Af Amer: >60 mL/min (ref >60)
GFR calc non Af Amer: >60 mL/min (ref >60)
GFR calc non Af Amer: >60 mL/min (ref >60)
Glucose, Bld: 163 mg/dL — ABNORMAL HIGH (ref 65–99)
Glucose, Bld: 123 mg/dL — ABNORMAL HIGH (ref 65–99)
POTASSIUM: 3.5 mmol/L (ref 3.5–5.1)
Potassium: 3.5 mmol/L (ref 3.5–5.1)
Sodium: 128 mmol/L — ABNORMAL LOW (ref 135–145)
Sodium: 132 mmol/L — ABNORMAL LOW (ref 135–145)

## 2014-12-01 LAB — URINE CULTURE: Culture: NO GROWTH

## 2014-12-01 SURGERY — INSERTION, INTRAMEDULLARY ROD, FEMUR
Anesthesia: Spinal | Laterality: Left

## 2014-12-01 MED ORDER — PHENYLEPHRINE HCL 10 MG/ML IJ SOLN
INTRAMUSCULAR | Status: DC | PRN
  Administered 2014-12-01 (×4): 100 ug via INTRAVENOUS

## 2014-12-01 MED ORDER — ENOXAPARIN SODIUM 30 MG/0.3ML ~~LOC~~ SOLN
30.0000 mg | Freq: Every day | SUBCUTANEOUS | Status: DC
  Administered 2014-12-02 – 2014-12-03 (×2): 30 mg via SUBCUTANEOUS
  Filled 2014-12-01 (×2): qty 0.3

## 2014-12-01 MED ORDER — CELECOXIB 200 MG PO CAPS
200.0000 mg | ORAL_CAPSULE | Freq: Two times a day (BID) | ORAL | Status: DC
  Administered 2014-12-01 – 2014-12-03 (×4): 200 mg via ORAL
  Filled 2014-12-01 (×4): qty 1

## 2014-12-01 MED ORDER — METOCLOPRAMIDE HCL 5 MG/ML IJ SOLN: 5.0000 mg | Freq: Three times a day (TID) | INTRAMUSCULAR | Status: DC | PRN

## 2014-12-01 MED ORDER — PROPOFOL 10 MG/ML IV BOLUS
INTRAVENOUS | Status: DC | PRN
  Administered 2014-12-01: 50 mg via INTRAVENOUS

## 2014-12-01 MED ORDER — HYDROMORPHONE HCL 1 MG/ML IJ SOLN: 0.2500 mg | INTRAMUSCULAR | Status: DC | PRN

## 2014-12-01 MED ORDER — CEFAZOLIN SODIUM 1-5 GM-% IV SOLN
1.0000 g | Freq: Four times a day (QID) | INTRAVENOUS | Status: AC
  Administered 2014-12-01 – 2014-12-02 (×3): 1 g via INTRAVENOUS
  Filled 2014-12-01 (×3): qty 50

## 2014-12-01 MED ORDER — DOCUSATE SODIUM 100 MG PO CAPS
100.0000 mg | ORAL_CAPSULE | Freq: Two times a day (BID) | ORAL | Status: DC
  Administered 2014-12-01 – 2014-12-02 (×3): 100 mg via ORAL
  Filled 2014-12-01 (×4): qty 1

## 2014-12-01 MED ORDER — KETAMINE HCL 50 MG/ML IJ SOLN
INTRAMUSCULAR | Status: DC | PRN
  Administered 2014-12-01 (×2): 50 mg via INTRAMUSCULAR

## 2014-12-01 MED ORDER — DIPHENHYDRAMINE HCL 12.5 MG/5ML PO ELIX: 12.5000 mg | ORAL_SOLUTION | ORAL | Status: DC | PRN

## 2014-12-01 MED ORDER — ONDANSETRON HCL 4 MG/2ML IJ SOLN: 4.0000 mg | Freq: Once | INTRAMUSCULAR | Status: DC | PRN

## 2014-12-01 MED ORDER — NEOMYCIN-POLYMYXIN B GU 40-200000 IR SOLN
Status: DC | PRN
  Administered 2014-12-01: 4 mL

## 2014-12-01 MED ORDER — MIDAZOLAM HCL 2 MG/2ML IJ SOLN
INTRAMUSCULAR | Status: DC | PRN
  Administered 2014-12-01: 2 mg via INTRAVENOUS

## 2014-12-01 MED ORDER — FENTANYL CITRATE (PF) 100 MCG/2ML IJ SOLN: 25.0000 ug | INTRAMUSCULAR | Status: DC | PRN

## 2014-12-01 MED ORDER — METOCLOPRAMIDE HCL 10 MG PO TABS: 5.0000 mg | ORAL_TABLET | Freq: Three times a day (TID) | ORAL | Status: DC | PRN

## 2014-12-01 MED ORDER — MAGNESIUM CITRATE PO SOLN: 1.0000 | Freq: Once | ORAL | Status: AC | PRN

## 2014-12-01 MED ORDER — POLYETHYLENE GLYCOL 3350 17 G PO PACK: 17.0000 g | PACK | Freq: Every day | ORAL | Status: DC | PRN

## 2014-12-01 MED ORDER — PROPOFOL INFUSION 10 MG/ML OPTIME
INTRAVENOUS | Status: DC | PRN
  Administered 2014-12-01: 100 ug/kg/min via INTRAVENOUS

## 2014-12-01 MED ORDER — NEOMYCIN-POLYMYXIN B GU 40-200000 IR SOLN
Status: AC
  Filled 2014-12-01: qty 4

## 2014-12-01 MED ORDER — CEFAZOLIN SODIUM 1-5 GM-% IV SOLN
INTRAVENOUS | Status: DC | PRN
  Administered 2014-12-01: 1 g via INTRAVENOUS

## 2014-12-01 MED ORDER — BUPIVACAINE HCL (PF) 0.5 % IJ SOLN
INTRAMUSCULAR | Status: DC | PRN
  Administered 2014-12-01: 2.5 mL via INTRATHECAL

## 2014-12-01 MED ORDER — BISACODYL 10 MG RE SUPP: 10.0000 mg | Freq: Every day | RECTAL | Status: DC | PRN

## 2014-12-01 SURGICAL SUPPLY — 48 items
BAG COUNTER SPONGE EZ (MISCELLANEOUS) ×2 IMPLANT
BIT DRILL AO GAMMA 4.2X180 (BIT) IMPLANT
BIT DRILL AO GAMMA 4.2X300 (BIT) IMPLANT
BIT DRILL CANN LG 4.3MM (BIT) ×1 IMPLANT
CANISTER SUCT 1200ML W/VALVE (MISCELLANEOUS) ×2 IMPLANT
DRAPE SHEET LG 3/4 BI-LAMINATE (DRAPES) ×2 IMPLANT
DRAPE SURG 17X11 SM STRL (DRAPES) ×2 IMPLANT
DRAPE U-SHAPE 47X51 STRL (DRAPES) ×2 IMPLANT
DRILL BIT CANN LG 4.3MM (BIT) ×2
DRSG OPSITE POSTOP 3X4 (GAUZE/BANDAGES/DRESSINGS) ×2 IMPLANT
DRSG OPSITE POSTOP 4X10 (GAUZE/BANDAGES/DRESSINGS) ×2 IMPLANT
DRSG OPSITE POSTOP 4X8 (GAUZE/BANDAGES/DRESSINGS) ×2 IMPLANT
DURAPREP 26ML APPLICATOR (WOUND CARE) ×2 IMPLANT
GAUZE PETRO XEROFOAM 1X8 (MISCELLANEOUS) ×2 IMPLANT
GLOVE BIOGEL M 6.5 STRL (GLOVE) ×2 IMPLANT
GLOVE INDICATOR 7.0 STRL GRN (GLOVE) ×2 IMPLANT
GLOVE SURG 9.0 ORTHO LTXF (GLOVE) ×2 IMPLANT
GOWN STRL REUS TWL 2XL XL LVL4 (GOWN DISPOSABLE) ×2 IMPLANT
GOWN STRL REUS W/ TWL LRG LVL3 (GOWN DISPOSABLE) ×1 IMPLANT
GOWN STRL REUS W/TWL LRG LVL3 (GOWN DISPOSABLE) ×1
GUIDEPIN VERSANAIL DSP 3.2X444 ×2 IMPLANT
GUIDEROD T2 3X1000 (ROD) IMPLANT
GUIDEWIRE BALL NOSE 100CM (WIRE) ×2 IMPLANT
HEMOVAC 400CC 10FR (MISCELLANEOUS) IMPLANT
HFN LAG SCREW 10.5MM X 85MM (Screw) ×2 IMPLANT
K-WIRE  3.2X450M STR (WIRE)
K-WIRE 3.2X450M STR (WIRE)
KIT RM TURNOVER CYSTO AR (KITS) ×2 IMPLANT
KWIRE 3.2X450M STR (WIRE) IMPLANT
NAIL HIP FRA AFFIX 130X9X340 L (Nail) ×2 IMPLANT
NS IRRIG 1000ML POUR BTL (IV SOLUTION) ×2 IMPLANT
NS IRRIG 500ML POUR BTL (IV SOLUTION) IMPLANT
PACK HIP COMPR (MISCELLANEOUS) ×2 IMPLANT
PAD GROUND ADULT SPLIT (MISCELLANEOUS) ×2 IMPLANT
REAMER ROD DEEP FLUTE 2.5X950 (INSTRUMENTS) IMPLANT
REAMER SHAFT BIXCUT (INSTRUMENTS) IMPLANT
SCREW BONE CORTICAL 5.0X36 (Screw) ×2 IMPLANT
STAPLER SKIN PROX 35W (STAPLE) ×2 IMPLANT
SUCTION FRAZIER TIP 10 FR DISP (SUCTIONS) ×2 IMPLANT
SUT VIC AB 2-0 CT1 27 (SUTURE) ×1
SUT VIC AB 2-0 CT1 TAPERPNT 27 (SUTURE) ×1 IMPLANT
SYR 30ML LL (SYRINGE) IMPLANT
affixus cortical bone screw ×2 IMPLANT
affixus left hip fracture nail ×2 IMPLANT
ball nose guidewire biomet ×2 IMPLANT
biomet affixus hip fracture nail lag screw ×2 IMPLANT
biomet distal graduated drill long ×2 IMPLANT
versanail threaded guide pin ×2 IMPLANT

## 2014-12-01 NOTE — Progress Notes (Signed)
[facility] [place] NAME: Rachael Palmer    MR#:  [id number]  DATE OF BIRTH:  08/23/1955  SUBJECTIVE:  . Patient complaining of cough and shortness of breath. No acute events overnight  REVIEW OF SYSTEMS:    Review of Systems  Constitutional: Negative for fever, chills and malaise/fatigue.  HENT: Negative for sore throat.   Eyes: Negative for blurred vision.  Respiratory: Positive for cough and shortness of breath. Negative for hemoptysis, sputum production and wheezing.   Cardiovascular: Negative for chest pain, palpitations and leg swelling.  Gastrointestinal: Negative for nausea, vomiting, abdominal pain, diarrhea and blood in stool.  Genitourinary: Negative for dysuria.  Musculoskeletal: Positive for falls. Negative for back pain.       Left hip pain  Neurological: Positive for weakness. Negative for dizziness, tremors, speech change, focal weakness and headaches.  Endo/Heme/Allergies: Does not bruise/bleed easily.    Tolerating Diet: Nothing by mouth      DRUG ALLERGIES:   Allergies  Allergen Reactions  . Penicillins Hives and Itching    VITALS:  Blood pressure 140/90, pulse 90, temperature 98.2 F (36.8 C), temperature source Oral, resp. rate 20, height 4' (1.219 m), weight 30.845 kg (68 lb), SpO2 94 %.  PHYSICAL EXAMINATION:   Physical Exam  Constitutional: She is oriented to person, place, and time. No distress.  Frail  HENT:  Head: Normocephalic.  Eyes: No scleral icterus.  Neck: Normal range of motion. Neck supple. No JVD present. No tracheal deviation present.  Cardiovascular: Normal rate, regular rhythm and normal heart sounds.  Exam reveals no gallop and no friction rub.   No murmur heard. Pulmonary/Chest: Effort normal and breath sounds normal. No respiratory distress. She has no wheezes. She has no rales. She exhibits no tenderness.  Abdominal: Soft. Bowel sounds are normal. She exhibits no  distension and no mass. There is no tenderness. There is no rebound and no guarding.  Musculoskeletal: Normal range of motion. She exhibits no edema.  Neurological: She is alert and oriented to person, place, and time.  Skin: Skin is warm. No rash noted. No erythema.  Psychiatric: Affect and judgment normal.      LABORATORY PANEL:   CBC  Recent Labs Lab 12/01/14 0354  WBC 8.5  HGB 10.8*  HCT 30.6*  PLT 367   ------------------------------------------------------------------------------------------------------------------  Chemistries   Recent Labs Lab 12/01/14 0354  NA 128*  K 3.5  CL 92*  CO2 28  GLUCOSE 163*  BUN 16  CREATININE 0.55  CALCIUM 8.8*   ------------------------------------------------------------------------------------------------------------------  Cardiac Enzymes  Recent Labs Lab 11/29/14 1815  TROPONINI <0.03   ------------------------------------------------------------------------------------------------------------------  RADIOLOGY:  Dg Chest 1 View  12/01/2014   IMPRESSION: COPD.  Known right lung mass stable.   Electronically Signed   By: Skipper Cliche M.D.   On: 12/01/2014 08:01   d   By: Lajean Manes M.D.   On: 11/29/2014 18:26   Ct Hip Left Wo Contrast  11/29/2014    IMPRESSION: 1. Mildly displaced, impacted and comminuted intratrochanteric fracture of left proximal femur as detailed above.   Electronically Signed   By: Lajean Manes M.D.   On: 11/29/2014 19:50       ASSESSMENT AND PLAN:   59 year old female with past medical history of COPD, tobacco abuse, alcohol abuse, osteopenia who had a mechanical fall and subsequently has a left hip fracture.  1. Left hip fracture: This is due to mechanical fall. Patient is moderate  risk for moderate procedure. Her sodium level is 128 today. She may proceed to surgery this morning as planned. She would be at high risk for alcoholic with drawl.   2. Hyponatremia: This is  multifactorial due to poor nutrition, EtOH abuse and liver cirrhosis. She also has underlying lung mass which may be contribute. Patient's currently on IV fluids as well as torsemide. Sodium level seems have responded to this regimen. She should continue on this regimen. Nephrology is following further management.  3. EtOH abuse: Patient is currently on CIWA protocol which I will continue.  4. Tobacco dependence: Patient was counseled for 3 minutes regarding tobacco dependence.  5. Essential hypertension: Continue Cardizem  6. COPD 2: No signs of acute exacerbation. Patient will continue Spiriva  7. Lung mass: Patient's PET scan was positive. Patient being followed by Dr. Raul Del. She was having some shortness of breath this morning with a normal lung exam. I ordered a chest x-ray which showed no evidence of acute pathology. She is not hypoxic.   Management plans discussed with the patient and she is in agreement.   CODE STATUS: Full  TOTAL TIME TAKING CARE OF THIS PATIENT: 27 minutes.     POSSIBLE D/C 3-4 days, DEPENDING ON CLINICAL CONDITION.   Trinadee Verhagen M.D on 12/01/2014 at 9:07 AM  Between 7am to 6pm - Pager - [phone] After 6pm go to [email or web address] - password [email or web address] [facility]  Office  [phone]  CC: Primary care physician; Kirk Ruths., MD

## 2014-12-01 NOTE — Anesthesia Preprocedure Evaluation (Signed)
Anesthesia Evaluation  Patient identified by MRN, date of birth, ID band Patient awake    Reviewed: Allergy & Precautions, H&P , NPO status , Patient's Chart, lab work & pertinent test results, reviewed documented beta blocker date and time   Airway Mallampati: II  TM Distance: >3 FB Neck ROM: full    Dental no notable dental hx.    Pulmonary neg pulmonary ROS, asthma , COPDCurrent Smoker,  breath sounds clear to auscultation  Pulmonary exam normal       Cardiovascular Exercise Tolerance: Good hypertension, negative cardio ROS  Rhythm:regular Rate:Normal     Neuro/Psych negative neurological ROS  negative psych ROS   GI/Hepatic negative GI ROS, Neg liver ROS,   Endo/Other  negative endocrine ROS  Renal/GU negative Renal ROS  negative genitourinary   Musculoskeletal   Abdominal   Peds  Hematology negative hematology ROS (+)   Anesthesia Other Findings   Reproductive/Obstetrics negative OB ROS                             Anesthesia Physical Anesthesia Plan  ASA: III and emergent  Anesthesia Plan: Spinal   Post-op Pain Management:    Induction:   Airway Management Planned:   Additional Equipment:   Intra-op Plan:   Post-operative Plan:   Informed Consent: I have reviewed the patients History and Physical, chart, labs and discussed the procedure including the risks, benefits and alternatives for the proposed anesthesia with the patient or authorized representative who has indicated his/her understanding and acceptance.   Dental Advisory Given  Plan Discussed with: CRNA  Anesthesia Plan Comments:         Anesthesia Quick Evaluation

## 2014-12-01 NOTE — Anesthesia Procedure Notes (Signed)
Spinal Patient location during procedure: OR Staffing Performed by: anesthesiologist  Preanesthetic Checklist Completed: patient identified, site marked, surgical consent, pre-op evaluation, timeout performed, IV checked, risks and benefits discussed and monitors and equipment checked Spinal Block Patient position: left lateral decubitus Prep: Betadine Patient monitoring: heart rate, cardiac monitor, continuous pulse ox and blood pressure Approach: midline Location: L3-4 Injection technique: single-shot Needle Needle type: Quincke  Needle gauge: 22 G Needle length: 9 cm Needle insertion depth: 6 cm Assessment Sensory level: T6 Additional Notes ;easly pass yielding +csf 4Q .  VSST.  Tolerated well.  JA

## 2014-12-01 NOTE — Op Note (Signed)
DATE OF SURGERY:  12/01/2014  TIME: 5:39 PM  PATIENT NAME:  Rachael Palmer  AGE: 59 y.o.  PRE-OPERATIVE DIAGNOSIS:  left intertrochanteric hip fracture  POST-OPERATIVE DIAGNOSIS:  SAME  PROCEDURE:  INTRAMEDULLARY (IM) NAIL FEMORAL, left hip  SURGEON:  [place]  OPERATIVE IMPLANTS: Biomet long Affixus nail 9 x 340 mm, 85 mm lag screw with a 36 mm distal interlocking screw  PREOPERATIVE INDICATIONS:  Rachael Palmer is a 59 y.o. year old who fell and suffered a hip fracture. She was brought into the ER and then admitted and medically cleared for surgical intervention.    The risks, benefits and alternatives were discussed with the patient and their family.  The risks include but are not limited to infection, bleeding, nerve or blood vessel injury, malunion, nonunion, hardware prominence, hardware failure, change in leg lengths or lower extremity rotation need for further surgery including hardware removal with conversion to a total hip arthroplasty. Medical risks include but are not limited to DVT and pulmonary embolism, myocardial infarction, stroke, pneumonia, respiratory failure and death. The patient and their family understood these risks and wished to proceed with surgery.  OPERATIVE PROCEDURE:  The patient was brought to the operating room and placed in the supine position on the fracture table. Spinal anesthesia was administered.   A closed reduction was performed under C-arm guidance.  The fracture reduction was confirmed on both AP and lateral views. A time out was performed to verify the patient's name, date of birth, medical record number, correct site of surgery correct procedure to be performed. The timeout was also used to verify the patient received antibiotics and all appropriate instruments, implants and radiographic studies were available in the room. Once all in attendance were in agreement, the case began. The patient was prepped and draped in a sterile fashion. She  received preoperative antibiotics.  An incision was made proximal to the greater trochanter in line with the femur. A guidewire was placed over the tip of the greater trochanter and advanced into the proximal femur to the level of the lesser trochanter.  Confirmation of the drill pin position was made on AP and lateral C-arm images.  The threaded guidepin was then overdrilled with the proximal femoral drill.  A long ball-tip guidewire was then advanced down the shaft of the femur to the knee. Its position was confirmed on AP and lateral C-arm imaging.  The length of the nail was then determined with a depth gauge. The nail was measured to 340 mm in length.  The nail was then inserted into the proximal femur, across the fracture site and into the femoral shaft. Its position was confirmed on AP and lateral C-arm images.   Once the nail was completely seated, the drill guide for the lag screw was placed through the guide arm for the Affixus nail. A guidepin was then placed through this drill guide and advanced through the lateral cortex of the femur, across the fracture site and into the femoral head achieving a tip apex distance of less than 25 mm. The length of the drill pin was measured to 85 mm in length, and then the drill for the lag screw was advanced through the lateral cortex, across the fracture site and up into the femoral head to the depth of the lag screw.  The lag screw was then advanced by hand into position across the fracture site into the femoral head. Its final position was confirmed on AP and lateral C-arm images. Compression was  applied as traction was carefully released. The set screw in the top of the intramedullary rod was tightened by hand using a screwdriver. It was backed off a quarter turn to allow for compression at the fracture site.  The attention was then turned to the distal interlocking screw. A perfect circle technique was utilized.  A small stab incision was made to allow the  drill bit to approximate the lateral cortex of the femur. The drill for the distal interlocking screw was then advanced bicortically. The depth of this drill was measured with a depth gauge. A distal interlocking screw of 36 mm was then inserted by hand. Final C-arm images of the entire intramedullary construct were taken in both the AP and lateral planes.   The wounds were irrigated copiously and closed with 0 Vicryl for closure of the deep fashion and 2-0 Vicryl for his subcutaneous closure. The skin was approximated with staples. A dry sterile dressing was applied. I was scrubbed and present the entire case and all sharp and instrument counts were correct at the conclusion of the case. Patient was transferred to hospital bed and brought to PACU in stable condition. I spoke with the patient's family in the postop consultation room to let them know the case had gone without complication and the patient was stable in the recovery room.  She will be partial weightbearing and begin physical and occupational therapy tomorrow. The patient will started on medical DVT prophylaxis tomorrow. Mechanical prophylaxis will be restarted tonight. Timoteo Gaul, MD

## 2014-12-01 NOTE — Progress Notes (Signed)
Subjective:  Patient reports pain as marked.  Patient is complaining of left hip pain today. She has been nothing by mouth today. Her sodium this morning was 128. On repeat metabolic profile recently she is now 132.  Objective:   VITALS:   Filed Vitals:   12/01/14 0407 12/01/14 0742 12/01/14 0827 12/01/14 1200  BP: 161/91 140/105 140/90 146/85  Pulse: 77 115 90 97  Temp: 98.1 F (36.7 C) 98.2 F (36.8 C)    TempSrc: Oral Oral    Resp: 18 20    Height:      Weight:      SpO2: 97% 94%     Left lower extremity: Neurologically intact Neurovascular intact Sensation intact distally Intact pulses distally Dorsiflexion/Plantar flexion intact No cellulitis present Compartment soft  LABS  Results for orders placed or performed during the hospital encounter of 11/29/14 (from the past 24 hour(s))  Basic metabolic panel     Status: Abnormal   Collection Time: 12/01/14  3:54 AM  Result Value Ref Range   Sodium 128 (L) 135 - 145 mmol/L   Potassium 3.5 3.5 - 5.1 mmol/L   Chloride 92 (L) 101 - 111 mmol/L   CO2 28 22 - 32 mmol/L   Glucose, Bld 163 (H) 65 - 99 mg/dL   BUN 16 6 - 20 mg/dL   Creatinine, Ser 0.55 0.44 - 1.00 mg/dL   Calcium 8.8 (L) 8.9 - 10.3 mg/dL   GFR calc non Af Amer >60 >60 mL/min   GFR calc Af Amer >60 >60 mL/min   Anion gap 8 5 - 15  CBC     Status: Abnormal   Collection Time: 12/01/14  3:54 AM  Result Value Ref Range   WBC 8.5 3.6 - 11.0 K/uL   RBC 2.94 (L) 3.80 - 5.20 MIL/uL   Hemoglobin 10.8 (L) 12.0 - 16.0 g/dL   HCT 30.6 (L) 35.0 - 47.0 %   MCV 104.0 (H) 80.0 - 100.0 fL   MCH 36.8 (H) 26.0 - 34.0 pg   MCHC 35.4 32.0 - 36.0 g/dL   RDW 12.6 11.5 - 14.5 %   Platelets 367 150 - 440 K/uL  Sodium     Status: Abnormal   Collection Time: 12/01/14 11:13 AM  Result Value Ref Range   Sodium 129 (L) 135 - 145 mmol/L  Basic metabolic panel     Status: Abnormal   Collection Time: 12/01/14  2:39 PM  Result Value Ref Range   Sodium 132 (L) 135 - 145 mmol/L    Potassium 3.5 3.5 - 5.1 mmol/L   Chloride 94 (L) 101 - 111 mmol/L   CO2 28 22 - 32 mmol/L   Glucose, Bld 123 (H) 65 - 99 mg/dL   BUN 7 6 - 20 mg/dL   Creatinine, Ser 0.49 0.44 - 1.00 mg/dL   Calcium 8.3 (L) 8.9 - 10.3 mg/dL   GFR calc non Af Amer >60 >60 mL/min   GFR calc Af Amer >60 >60 mL/min   Anion gap 10 5 - 15    Dg Chest 1 View  12/01/2014   CLINICAL DATA:  Shortness of breath history of asthma and COPD, patient smokes  EXAM: CHEST  1 VIEW  COMPARISON:  11/29/2014  FINDINGS: Heart size and vascular pattern normal. Hyperinflation suggests COPD. No consolidation effusion or pneumothorax. Known infrahilar right lung mass stable.  IMPRESSION: COPD.  Known right lung mass stable.   Electronically Signed   By: Elodia Florence.D.  On: 12/01/2014 08:01   Dg Chest 1 View  11/29/2014   CLINICAL DATA:  Pt fell from standing today onto kitchen floor. Injuring left side. pts c/o left hip pain, left leg is externally rotated. No previous surgeries.  EXAM: CHEST  1 VIEW  COMPARISON:  Chest CT, 11/04/2014  FINDINGS: Right infrahilar mass is noted as better defined on the recent CT. There is no lung consolidation or edema. No pleural effusion or pneumothorax.  Cardiac silhouette is normal in size. No mediastinal masses or convincing adenopathy. Left hilum is unremarkable. New  Bones are demineralized. Compression fractures are noted of the thoracic spine as were present on the prior CT.  IMPRESSION: 1. No acute cardiopulmonary disease. 2. Right lower lobe mass. Please refer to the recent prior CT scan of the chest report. 3. Multiple thoracic compression fractures.   Electronically Signed   By: Lajean Manes M.D.   On: 11/29/2014 18:26   Ct Hip Left Wo Contrast  11/29/2014   EXAM: CT OF THE LEFT HIP WITHOUT CONTRAST  TECHNIQUE: Multidetector CT imaging of the left hip was performed according to the standard protocol. Multiplanar CT image reconstructions were also generated.  COMPARISON:  Current left hip  radiograph showing a left proximal femur fracture.  FINDINGS: As noted on standard radiographs, there is a displaced comminuted intertrochanteric fracture of the proximal femur. Primary fracture line extends from the medial base of the femoral neck through the greater trochanter. There are additional fracture fragments from the lesser and greater trochanters. Major fracture fragments displaced, with the base of the femoral neck displacing lateral to the shaft component by 15 mm. Fracture displacement is also anterior measuring approximate 12 mm. This leads to mild apex anterior angulation. There is also mild varus angulation. The major fracture components are impacted, again by approximate 1.5 cm.  There are no other fractures. The left hip joint is normally aligned.  There is a left hip joint effusion consistent with hemarthrosis, small to moderate in size.  Surrounding muscular structures are unremarkable.  Within the pelvis, Foley catheter partly decompresses the bladder. There are vascular calcifications. No masses or adenopathy. No ascites or abnormal fluid collections.  IMPRESSION: 1. Mildly displaced, impacted and comminuted intratrochanteric fracture of left proximal femur as detailed above.   Electronically Signed   By: Lajean Manes M.D.   On: 11/29/2014 19:50   Chest Portable 1 View  11/29/2014   CLINICAL DATA:  Hip fracture.  Preop study.  EXAM: PORTABLE CHEST - 1 VIEW  COMPARISON:  11/29/2014 at 1803 hours  FINDINGS: No change from the earlier exam. There is an infrahilar mass is noted previously, better evaluated on the chest CT dated 11/04/2014.  There is no lung consolidation or edema. No pleural effusion or pneumothorax.  Cardiac silhouette is normal in size. No mediastinal mass or convincing adenopathy.  Bony thorax is demineralized. There are old healed rib fractures on right.  IMPRESSION: No acute cardiopulmonary disease.  Right lower lobe, infrahilar mass.   Electronically Signed   By: Lajean Manes M.D.   On: 11/29/2014 19:44   Dg Hip Unilat With Pelvis 2-3 Views Left  11/29/2014   CLINICAL DATA:  Hip pain after falling today.  Initial encounter.  EXAM: DG HIP (WITH OR WITHOUT PELVIS) 2-3V LEFT  COMPARISON:  PET-CT 11/14/2014.  FINDINGS: The bones are diffusely demineralized. There is an acute comminuted and mildly angulated intertrochanteric femur fracture on the left. Old fracture of the left inferior pubic ramus appears  unchanged from prior CT. Patient is status post right femoral neck dynamic screw and intramedullary rod fixation. No evidence of dislocation. Mild sacroiliac and lower lumbar spine degenerative changes are present. There are diffuse vascular calcifications.  IMPRESSION: Acute, comminuted and angulated intertrochanteric left femur fracture.   Electronically Signed   By: Richardean Sale M.D.   On: 11/29/2014 18:25    Assessment/Plan: Day of Surgery   Active Problems:   Hip fracture   Protein-calorie malnutrition, severe  I have contacted Dr. Vashti Hey from anesthesia. We discussed the recent metabolic profile. He understands the sodium level is now 132. He has agreed to proceed with surgery. I've informed the patient of the decision to proceed with surgery and have alerted the OR. Patient will be taken to the OR to have her case done within the hour. I have also discussed this with Dr. Benjie Karvonen and she is in agreement with the plan for surgery. She feels the patient is medically cleared for this procedure.    Thornton Park , MD 12/01/2014, 3:25 PM

## 2014-12-01 NOTE — Progress Notes (Signed)
Subjective:  Sodium level has improved to 128 this morning. Patient is more alert today. Urine output recorded at 1600 cc last 24 hours. Now she has a Foley catheter. Urine bag is full of clear yellow urine.   Objective:  Vital signs in last 24 hours:  Temp:  [98.1 F (36.7 C)-98.6 F (37 C)] 98.2 F (36.8 C) (08/07 0742) Pulse Rate:  [77-121] 90 (08/07 0827) Resp:  [18-20] 20 (08/07 0742) BP: (140-164)/(86-105) 140/90 mmHg (08/07 0827) SpO2:  [94 %-100 %] 94 % (08/07 0742)  Weight change:  Filed Weights   11/29/14 1737  Weight: 30.845 kg (68 lb)    Intake/Output: I/O last 3 completed shifts: In: 1050 [I.V.:1050] Out: 4000 [Urine:4000]     Physical Exam: General:  thin, cachectic, laying in the bed, no distress   HEENT  anicteric, moist mucous membranes   Neck  supple   Pulm/lungs  clear anteriorly and laterally, normal effort   CVS/Heart  no rub or gallop   Abdomen:   soft, nontender, nondistended   Extremities:  no peripheral edema   Neurologic:  alert, able to answer all questions   Skin:  no acute lesions   Access:        Basic Metabolic Panel:  Recent Labs Lab 11/29/14 1815 11/30/14 0350 11/30/14 1311 12/01/14 0354  NA 116* 124* 125* 128*  K 3.8 3.5 4.1 3.5  CL 73* 87* 87* 92*  CO2 '29 28 23 28  '$ GLUCOSE 73 68 110* 163*  BUN <5* <5* 8 16  CREATININE 0.36* 0.41* 0.60 0.55  CALCIUM 8.4* 8.1* 8.6* 8.8*     CBC:  Recent Labs Lab 11/29/14 1815 11/30/14 0350 12/01/14 0354  WBC 7.2 5.5 8.5  HGB 12.6 11.9* 10.8*  HCT 35.7 32.8* 30.6*  MCV 101.6* 102.8* 104.0*  PLT 434 365 367      Microbiology: Results for orders placed or performed during the hospital encounter of 11/29/14  Urine culture     Status: None   Collection Time: 11/29/14  7:49 PM  Result Value Ref Range Status   Specimen Description URINE, RANDOM  Final   Special Requests NONE  Final   Culture NO GROWTH 2 DAYS  Final   Report Status 12/01/2014 FINAL  Final  Surgical pcr  screen     Status: None   Collection Time: 11/30/14  6:30 AM  Result Value Ref Range Status   MRSA, PCR NEGATIVE NEGATIVE Final   Staphylococcus aureus NEGATIVE NEGATIVE Final    Comment:        The Xpert SA Assay (FDA approved for NASAL specimens in patients over 40 years of age), is one component of a comprehensive surveillance program.  Test performance has been validated by [facility] for patients greater than or equal to 66 year old. It is not intended to diagnose infection nor to guide or monitor treatment.     Coagulation Studies:  Recent Labs  11/29/14 1949  LABPROT 13.1  INR 0.97    Urinalysis: No results for input(s): COLORURINE, LABSPEC, PHURINE, GLUCOSEU, HGBUR, BILIRUBINUR, KETONESUR, PROTEINUR, UROBILINOGEN, NITRITE, LEUKOCYTESUR in the last 72 hours.  Invalid input(s): APPERANCEUR    Imaging: Dg Chest 1 View  12/01/2014   CLINICAL DATA:  Shortness of breath history of asthma and COPD, patient smokes  EXAM: CHEST  1 VIEW  COMPARISON:  11/29/2014  FINDINGS: Heart size and vascular pattern normal. Hyperinflation suggests COPD. No consolidation effusion or pneumothorax. Known infrahilar right lung mass stable.  IMPRESSION: COPD.  Known right lung mass stable.   Electronically Signed   By: Skipper Cliche M.D.   On: 12/01/2014 08:01   Dg Chest 1 View  11/29/2014   CLINICAL DATA:  Pt fell from standing today onto kitchen floor. Injuring left side. pts c/o left hip pain, left leg is externally rotated. No previous surgeries.  EXAM: CHEST  1 VIEW  COMPARISON:  Chest CT, 11/04/2014  FINDINGS: Right infrahilar mass is noted as better defined on the recent CT. There is no lung consolidation or edema. No pleural effusion or pneumothorax.  Cardiac silhouette is normal in size. No mediastinal masses or convincing adenopathy. Left hilum is unremarkable. New  Bones are demineralized. Compression fractures are noted of the thoracic spine as were present on the prior CT.   IMPRESSION: 1. No acute cardiopulmonary disease. 2. Right lower lobe mass. Please refer to the recent prior CT scan of the chest report. 3. Multiple thoracic compression fractures.   Electronically Signed   By: Lajean Manes M.D.   On: 11/29/2014 18:26   Ct Hip Left Wo Contrast  11/29/2014   EXAM: CT OF THE LEFT HIP WITHOUT CONTRAST  TECHNIQUE: Multidetector CT imaging of the left hip was performed according to the standard protocol. Multiplanar CT image reconstructions were also generated.  COMPARISON:  Current left hip radiograph showing a left proximal femur fracture.  FINDINGS: As noted on standard radiographs, there is a displaced comminuted intertrochanteric fracture of the proximal femur. Primary fracture line extends from the medial base of the femoral neck through the greater trochanter. There are additional fracture fragments from the lesser and greater trochanters. Major fracture fragments displaced, with the base of the femoral neck displacing lateral to the shaft component by 15 mm. Fracture displacement is also anterior measuring approximate 12 mm. This leads to mild apex anterior angulation. There is also mild varus angulation. The major fracture components are impacted, again by approximate 1.5 cm.  There are no other fractures. The left hip joint is normally aligned.  There is a left hip joint effusion consistent with hemarthrosis, small to moderate in size.  Surrounding muscular structures are unremarkable.  Within the pelvis, Foley catheter partly decompresses the bladder. There are vascular calcifications. No masses or adenopathy. No ascites or abnormal fluid collections.  IMPRESSION: 1. Mildly displaced, impacted and comminuted intratrochanteric fracture of left proximal femur as detailed above.   Electronically Signed   By: Lajean Manes M.D.   On: 11/29/2014 19:50   Chest Portable 1 View  11/29/2014   CLINICAL DATA:  Hip fracture.  Preop study.  EXAM: PORTABLE CHEST - 1 VIEW  COMPARISON:   11/29/2014 at 1803 hours  FINDINGS: No change from the earlier exam. There is an infrahilar mass is noted previously, better evaluated on the chest CT dated 11/04/2014.  There is no lung consolidation or edema. No pleural effusion or pneumothorax.  Cardiac silhouette is normal in size. No mediastinal mass or convincing adenopathy.  Bony thorax is demineralized. There are old healed rib fractures on right.  IMPRESSION: No acute cardiopulmonary disease.  Right lower lobe, infrahilar mass.   Electronically Signed   By: Lajean Manes M.D.   On: 11/29/2014 19:44   Dg Hip Unilat With Pelvis 2-3 Views Left  11/29/2014   CLINICAL DATA:  Hip pain after falling today.  Initial encounter.  EXAM: DG HIP (WITH OR WITHOUT PELVIS) 2-3V LEFT  COMPARISON:  PET-CT 11/14/2014.  FINDINGS: The bones are diffusely demineralized. There is an acute comminuted  and mildly angulated intertrochanteric femur fracture on the left. Old fracture of the left inferior pubic ramus appears unchanged from prior CT. Patient is status post right femoral neck dynamic screw and intramedullary rod fixation. No evidence of dislocation. Mild sacroiliac and lower lumbar spine degenerative changes are present. There are diffuse vascular calcifications.  IMPRESSION: Acute, comminuted and angulated intertrochanteric left femur fracture.   Electronically Signed   By: Richardean Sale M.D.   On: 11/29/2014 18:25     Medications:   . sodium chloride 50 mL/hr at 11/30/14 1713   . aspirin  81 mg Oral Daily  . diltiazem  60 mg Oral BID  . feeding supplement (ENSURE ENLIVE)  237 mL Oral BID BM  . folic acid  1 mg Oral Daily  . mirtazapine  7.5 mg Oral QHS  . multivitamin with minerals  1 tablet Oral Daily  . potassium chloride  10 mEq Oral Daily  . predniSONE  20 mg Oral Daily  . thiamine  100 mg Oral Daily   Or  . thiamine  100 mg Intravenous Daily  . tiotropium  18 mcg Inhalation Daily  . torsemide  5 mg Oral Daily  . traZODone  100 mg Oral QHS    acetaminophen **OR** acetaminophen, albuterol, labetalol, LORazepam **OR** LORazepam, morphine injection, ondansetron **OR** ondansetron (ZOFRAN) IV, oxyCODONE  Assessment/ Plan:  59 y.o. caucasion female with a PMHX of HTN, Emphysema, lung mass, was admitted to [facility] on 11/29/2014 with Left Hip fracture.  1. Hyponatremia. Baseline sodium level is unknown. Multiple risk factors including poor nutrition, alcohol abuse, potential for having liver cirrhosis, possible contribution from underlying lung disease emphysema, lung mass Urine osmolality is less than urine osmolality suggesting free water clearance Sodium level improved to 128 this morning Will continue to monitor. Repeat labs this afternoon Currently getting IV saline at 50 cc per hour and torsemide 5 mg daily 2. Left hip fracture. Surgery planned for later today   LOS: 2 Jecenia Leamer 8/7/201611:21 AM

## 2014-12-01 NOTE — Transfer of Care (Signed)
Immediate Anesthesia Transfer of Care Note  Patient: Rachael Palmer  Procedure(s) Performed: Procedure(s): INTRAMEDULLARY (IM) NAIL FEMORAL (Left)  Patient Location: PACU  Anesthesia Type:Spinal  Level of Consciousness: awake and patient cooperative  Airway & Oxygen Therapy: Patient connected to face mask oxygen  Post-op Assessment: Report given to RN  Post vital signs: stable  Last Vitals:  Filed Vitals:   12/01/14 1540  BP: 157/84  Pulse: 108  Temp: 36.7 C  Resp: 18    Complications: No apparent anesthesia complications

## 2014-12-02 ENCOUNTER — Encounter: Payer: Self-pay | Admitting: Orthopedic Surgery

## 2014-12-02 LAB — BASIC METABOLIC PANEL
ANION GAP: 8 (ref 5–15)
BUN: <5 mg/dL — ABNORMAL LOW (ref 6–20)
CO2: 25 mmol/L (ref 22–32)
Calcium: 7 mg/dL — ABNORMAL LOW (ref 8.9–10.3)
Chloride: 100 mmol/L — ABNORMAL LOW (ref 101–111)
Creatinine, Ser: 0.52 mg/dL (ref 0.44–1.00)
GFR calc Af Amer: >60 mL/min (ref >60)
Glucose, Bld: 112 mg/dL — ABNORMAL HIGH (ref 65–99)
Potassium: 2.8 mmol/L — CL (ref 3.5–5.1)
SODIUM: 133 mmol/L — AB (ref 135–145)

## 2014-12-02 LAB — VITAMIN D 25 HYDROXY (VIT D DEFICIENCY, FRACTURES): Vit D, 25-Hydroxy: 55.9 ng/mL (ref 30.0–100.0)

## 2014-12-02 LAB — CBC
HCT: 24.8 % — ABNORMAL LOW (ref 35.0–47.0)
HEMOGLOBIN: 9 g/dL — AB (ref 12.0–16.0)
MCH: 38.2 pg — ABNORMAL HIGH (ref 26.0–34.0)
MCHC: 36.5 g/dL — ABNORMAL HIGH (ref 32.0–36.0)
MCV: 104.7 fL — ABNORMAL HIGH (ref 80.0–100.0)
Platelets: 329 10*3/uL (ref 150–440)
RBC: 2.36 MIL/uL — ABNORMAL LOW (ref 3.80–5.20)
RDW: 12.7 % (ref 11.5–14.5)
WBC: 8.3 10*3/uL (ref 3.6–11.0)

## 2014-12-02 LAB — CORTISOL: Cortisol, Plasma: 10.1 ug/dL

## 2014-12-02 LAB — TSH: TSH: 1.476 u[IU]/mL (ref 0.350–4.500)

## 2014-12-02 MED ORDER — POLYETHYLENE GLYCOL 3350 17 G PO PACK
17.0000 g | PACK | Freq: Every day | ORAL | Status: DC
  Administered 2014-12-02: 17 g via ORAL
  Filled 2014-12-02: qty 1

## 2014-12-02 MED ORDER — POTASSIUM CHLORIDE 20 MEQ PO PACK
20.0000 meq | PACK | Freq: Once | ORAL | Status: AC
  Administered 2014-12-02: 20 meq via ORAL
  Filled 2014-12-02: qty 1

## 2014-12-02 MED ORDER — POTASSIUM CHLORIDE CRYS ER 20 MEQ PO TBCR
40.0000 meq | EXTENDED_RELEASE_TABLET | Freq: Once | ORAL | Status: AC
  Administered 2014-12-02: 40 meq via ORAL
  Filled 2014-12-02: qty 2

## 2014-12-02 MED ORDER — ALBUTEROL SULFATE HFA 108 (90 BASE) MCG/ACT IN AERS
2.0000 | INHALATION_SPRAY | RESPIRATORY_TRACT | Status: DC | PRN
  Administered 2014-12-03: 2 via RESPIRATORY_TRACT
  Filled 2014-12-02: qty 6.7

## 2014-12-02 NOTE — Evaluation (Signed)
Physical Therapy Evaluation Patient Details Name: Rachael Palmer MRN: [id number] DOB: Feb 05, 1956 Today's Date: 12/02/2014   History of Present Illness  This patient is a 59 year old female who came to [facility] after a fall, suffering a left hip fracture. She recieved an ORIF repair.  Clinical Impression  Pt presents with hx of asthma, HTN, COPD, lung mass, and alcohol abuse. Per discussion with nurse, pt's potassium was being replaced and PT was cleared to work with her. Secondary to hyponatremia, pt's surgery was not performed until 12/01/14, which is why PT is seeing her this date. Examination reveals that pt performs bed mobility with mod assist, transfers with min assist, and ambulation with min assist, but a great deal of cues and encouragement necessary for all mobility. Pt has generalized weakness, c/o pain with mobility, and decreased activity tolerance. With encouragement she will perform the tasks asked of her. Pt will benefit from skilled PT in order to address these deficits in order for her to safely return to her baseline, which is living at home with her mother.    Follow Up Recommendations SNF    Equipment Recommendations  Rolling walker with 5" wheels    Recommendations for Other Services       Precautions / Restrictions        Mobility  Bed Mobility Overal bed mobility: Needs Assistance Bed Mobility: Supine to Sit     Supine to sit: Mod assist     General bed mobility comments: Needs assist to get to EOB and LE managment.  (Log roll best method to manage lumbar fractures)  Transfers Overall transfer level: Needs assistance Equipment used: Rolling walker (2 wheeled)   Sit to Stand: Min assist         General transfer comment: Pt needs assist to get fully into rising. She needs cues to push through walker in order to remain [facility]   Ambulation/Gait     Assistive device: Rolling walker (2 wheeled)   Gait velocity: severely decreased    General Gait Details: Pt  needs heavy cues to sequence walker with gait. She needs min assist for weight shift in order for her to initiate step. Pt needs encouragement to complete task as she frequently states that she cannot do it  Financial trader [employer] (Stroke Patients Only)       Balance                                             Pertinent Vitals/Pain      Home Living Family/patient expects to be discharged to:: Private residence Living Arrangements:  (Lives with mother) Available Help at Discharge: Family;Available 24 hours/day Type of Home: House Home Access: Level entry     Home Layout: One level   Additional Comments: Pt lives with mother, states she can have 24 hr care if needed (provided by mother and sisters)    Prior Function Level of Independence: Independent         Comments: Independent with ADLs, ambulation and driving     Hand Dominance        Extremity/Trunk Assessment                         Communication   Communication: No difficulties  Cognition Arousal/Alertness: Awake/alert Behavior During Therapy: Agitated;Anxious Overall Cognitive Status: Within Functional Limits for tasks assessed                      General Comments      Exercises Other Exercises Other Exercises: Pt performed bilateral LE therapeutic exercise x10 reps with min assist for facilitation of movement. Exercises included: ankle pumps, SAQ, quad sets and glute sets.       Assessment/Plan    PT Assessment Patient needs continued PT services  PT Diagnosis Difficulty walking;Abnormality of gait;Generalized weakness;Acute pain   PT Problem List Decreased strength;Decreased range of motion;Decreased activity tolerance;Decreased balance;Decreased mobility;Decreased knowledge of use of DME;Decreased safety awareness;Decreased knowledge of precautions;Pain  PT Treatment Interventions DME instruction;Stair  training;Gait training;Functional mobility training;Therapeutic activities;Therapeutic exercise;Neuromuscular re-education;Balance training;Cognitive remediation;Patient/family education;Manual techniques   PT Goals (Current goals can be found in the Care Plan section) Acute Rehab PT Goals Patient Stated Goal: to go home PT Goal Formulation: With patient Time For Goal Achievement: 12/16/14 Potential to Achieve Goals: Fair    Frequency BID   Barriers to discharge        Co-evaluation               End of Session Equipment Utilized During Treatment: Gait belt Activity Tolerance: Patient limited by pain Patient left: in chair;with call bell/phone within reach;with chair alarm set;with nursing/sitter in room Nurse Communication: Mobility status         Time:  -      Charges:         PT G CodesJanyth Contes 2014-12-15, 4:33 PM Janyth Contes, SPT. [phone]

## 2014-12-02 NOTE — Care Management Note (Addendum)
Case Management Note  Patient Details  Name: Rachael Palmer MRN: [id number] Date of Birth: [birth year]  Subjective/Objective:                  Met with patient to discuss discharge planning. Patient is POD1 today; PT pending. Patient is on CIWA precautions. Patient seemed disgruntled about home health, rehab, DME, Rx. She states she lives with her mother and has 4 siblings. She mentioned on sister Rachael Palmer that "can help with her shots". She states she uses [facility] but unsure of actual location. She has a bedside commode, shower chair, and standard walker. She does however agree to Lovenox being called in to her pharmacy.   Action/Plan: List of home health providers provided- patient refused. Spoke to patient regarding front-wheeled walker- she refused. Lovenox 58m #28 called in to [facility] [phone]  Expected Discharge Date:  12/05/14               Expected Discharge Plan:     In-House Referral:  Clinical Social Work  Discharge planning Services  CM Consult  Post Acute Care Choice:  Home Health, Durable Medical Equipment Choice offered to:  Patient  DME Arranged:    DME Agency:     HH Arranged:    HLowryAgency:     Status of Service:  In process, will continue to follow  Medicare Important Message Given:    Date Medicare IM Given:    Medicare IM give by:    Date Additional Medicare IM Given:    Additional Medicare Important Message give by:     If discussed at [place] Stay Meetings, dates discussed:    Additional Comments: Patient continues to refuse front-wheeled walker. RN advised patient to bring her walker to hospital to get home. She had no preference for home health- ASwannotified of patient discharge. Lovenox $1.20.  AMarshell Garfinkel RN 12/02/2014, 9:50 AM

## 2014-12-02 NOTE — Progress Notes (Signed)
Kt 2.8 contacted reddy md order received

## 2014-12-02 NOTE — Anesthesia Postprocedure Evaluation (Signed)
  Anesthesia Post-op Note  Patient: Rachael Palmer  Procedure(s) Performed: Procedure(s): INTRAMEDULLARY (IM) NAIL FEMORAL (Left)  Anesthesia type:Spinal  Patient location: PACU  Post pain: Pain level controlled  Post assessment: Post-op Vital signs reviewed, Patient's Cardiovascular Status Stable, Respiratory Function Stable, Patent Airway and No signs of Nausea or vomiting  Post vital signs: Reviewed and stable  Last Vitals:  Filed Vitals:   12/02/14 0453  BP: 150/86  Pulse: 103  Temp: 37 C  Resp: 18    Level of consciousness: awake, alert  and patient cooperative  Complications: No apparent anesthesia complications

## 2014-12-02 NOTE — Progress Notes (Signed)
Subjective:  Sodium level has improved to 132 this morning. Patient is more alert today. Urine output recorded at 2700 cc last 24 hours. Now she has a Foley catheter.  Patient underwent PROCEDURE: INTRAMEDULLARY (IM) NAIL FEMORAL, left hip, SURGEON: KRASINSKI, KEVIN yesterday    Objective:  Vital signs in last 24 hours:  Temp:  [97.4 F (36.3 C)-99 F (37.2 C)] 98.4 F (36.9 C) (08/08 0759) Pulse Rate:  [66-131] 109 (08/08 0759) Resp:  [16-18] 16 (08/08 0759) BP: (119-182)/(66-92) 156/83 mmHg (08/08 0759) SpO2:  [92 %-99 %] 95 % (08/08 0759)  Weight change:  Filed Weights   11/29/14 1737  Weight: 30.845 kg (68 lb)    Intake/Output: I/O last 3 completed shifts: In: 3350 [I.V.:3350] Out: 3700 [Urine:3700]     Physical Exam: General:  thin, cachectic, laying in the bed, no distress   HEENT  anicteric, moist mucous membranes   Neck  supple   Pulm/lungs  clear anteriorly and laterally, normal effort   CVS/Heart  no rub or gallop   Abdomen:   soft, nontender, nondistended   Extremities:  no peripheral edema   Neurologic:  alert, able to answer all questions   Skin:  no acute lesions   Access:        Basic Metabolic Panel:  Recent Labs Lab 11/30/14 0350 11/30/14 1311 12/01/14 0354 12/01/14 1113 12/01/14 1439 12/02/14 0400  NA 124* 125* 128* 129* 132* 133*  K 3.5 4.1 3.5  --  3.5 2.8*  CL 87* 87* 92*  --  94* 100*  CO2 '28 23 28  '$ --  28 25  GLUCOSE 68 110* 163*  --  123* 112*  BUN <5* 8 16  --  7 <5*  CREATININE 0.41* 0.60 0.55  --  0.49 0.52  CALCIUM 8.1* 8.6* 8.8*  --  8.3* 7.0*     CBC:  Recent Labs Lab 11/29/14 1815 11/30/14 0350 12/01/14 0354 12/02/14 0400  WBC 7.2 5.5 8.5 8.3  HGB 12.6 11.9* 10.8* 9.0*  HCT 35.7 32.8* 30.6* 24.8*  MCV 101.6* 102.8* 104.0* 104.7*  PLT 434 365 367 329      Microbiology: Results for orders placed or performed during the hospital encounter of 11/29/14  Urine culture     Status: None   Collection Time:  11/29/14  7:49 PM  Result Value Ref Range Status   Specimen Description URINE, RANDOM  Final   Special Requests NONE  Final   Culture NO GROWTH 2 DAYS  Final   Report Status 12/01/2014 FINAL  Final  Surgical pcr screen     Status: None   Collection Time: 11/30/14  6:30 AM  Result Value Ref Range Status   MRSA, PCR NEGATIVE NEGATIVE Final   Staphylococcus aureus NEGATIVE NEGATIVE Final    Comment:        The Xpert SA Assay (FDA approved for NASAL specimens in patients over 65 years of age), is one component of a comprehensive surveillance program.  Test performance has been validated by [facility] for patients greater than or equal to 51 year old. It is not intended to diagnose infection nor to guide or monitor treatment.     Coagulation Studies:  Recent Labs  11/29/14 1949  LABPROT 13.1  INR 0.97    Urinalysis: No results for input(s): COLORURINE, LABSPEC, PHURINE, GLUCOSEU, HGBUR, BILIRUBINUR, KETONESUR, PROTEINUR, UROBILINOGEN, NITRITE, LEUKOCYTESUR in the last 72 hours.  Invalid input(s): APPERANCEUR    Imaging: Dg Chest 1 View  12/01/2014  CLINICAL DATA:  Shortness of breath history of asthma and COPD, patient smokes  EXAM: CHEST  1 VIEW  COMPARISON:  11/29/2014  FINDINGS: Heart size and vascular pattern normal. Hyperinflation suggests COPD. No consolidation effusion or pneumothorax. Known infrahilar right lung mass stable.  IMPRESSION: COPD.  Known right lung mass stable.   Electronically Signed   By: Skipper Cliche M.D.   On: 12/01/2014 08:01   Dg Hip Unilat With Pelvis 1v Left  12/01/2014   CLINICAL DATA:  Post LEFT hip operative repair today  EXAM: DG HIP (WITH OR WITHOUT PELVIS) 1V*L*  COMPARISON:  11/29/2014  FINDINGS: Interval placement of IM nail with proximal compression screw at the LEFT femur post ORIF of a reduced intertrochanteric LEFT femoral fracture.  No dislocation.  Lateral soft tissue gas throughout the thigh.  Distal locking screw present at the  IM nail.  IM nail with compression screw at proximal RIGHT femur unchanged.  Symmetric hip and SI joints.  Old healed inferior LEFT pubic ramus fracture.  Scattered atherosclerotic calcification and pelvic the lips.  IMPRESSION: Post ORIF of a reduced intertrochanteric LEFT femoral fracture.   Electronically Signed   By: Lavonia Dana M.D.   On: 12/01/2014 18:25   Dg Hip Operative Unilat With Pelvis Left  12/01/2014   CLINICAL DATA:  LEFT hip surgery  EXAM: OPERATIVE LEFT HIP (WITH PELVIS IF PERFORMED)  VIEWS  TECHNIQUE: Fluoroscopic spot image(s) were submitted for interpretation post-operatively.  FLUOROSCOPY TIME:  0 minutes 49 seconds  Number of images: 4  COMPARISON:  CT LEFT hip 11/29/2014  FINDINGS: Osseous demineralization.  Placement of a long IM nail with proximal compression screw at the proximal LEFT femur post ORIF of a reduced intertrochanteric fracture.  No additional fracture or dislocation.  Distal locking screw present at IM nail.  IMPRESSION: ORIF of an intertrochanteric fracture of LEFT femur.   Electronically Signed   By: Lavonia Dana M.D.   On: 12/01/2014 17:43     Medications:   . sodium chloride 50 mL/hr at 12/02/14 0945   . aspirin  81 mg Oral Daily  . celecoxib  200 mg Oral Q12H  . diltiazem  60 mg Oral BID  . docusate sodium  100 mg Oral BID  . enoxaparin (LOVENOX) injection  30 mg Subcutaneous Daily  . feeding supplement (ENSURE ENLIVE)  237 mL Oral BID BM  . folic acid  1 mg Oral Daily  . mirtazapine  7.5 mg Oral QHS  . multivitamin with minerals  1 tablet Oral Daily  . potassium chloride  10 mEq Oral Daily  . predniSONE  20 mg Oral Daily  . thiamine  100 mg Oral Daily   Or  . thiamine  100 mg Intravenous Daily  . tiotropium  18 mcg Inhalation Daily  . torsemide  5 mg Oral Daily  . traZODone  100 mg Oral QHS   acetaminophen **OR** acetaminophen, albuterol, bisacodyl, diphenhydrAMINE, labetalol, LORazepam **OR** LORazepam, metoCLOPramide **OR** metoCLOPramide  (REGLAN) injection, morphine injection, ondansetron **OR** ondansetron (ZOFRAN) IV, oxyCODONE, polyethylene glycol  Assessment/ Plan:  59 y.o. caucasion female with a PMHX of HTN, Emphysema, lung mass, was admitted to [facility] on 11/29/2014 with Left Hip fracture.  1. Hyponatremia. Baseline sodium level is unknown. Multiple risk factors including poor nutrition, alcohol abuse, potential for having liver cirrhosis, possible contribution from underlying lung disease emphysema, lung mass Currently getting IV saline at 50 cc per hour and torsemide 5 mg daily D/C iv fluids and diuretic Discussed quitting  alcohol 2. Left hip fracture. Surgery done 8/7 3. Hypokalemia- replace prn Will sign off.  Please re-consult as necessary     LOS: 3 Spiro Ausborn 8/8/201610:15 AM

## 2014-12-02 NOTE — Evaluation (Signed)
Occupational Therapy Evaluation Patient Details Name: Rachael Palmer MRN: [id number] DOB: [birth year] Today's Date: 12/02/2014    History of Present Illness This patient is a 59 year old female who came to [facility] after a fall suffering a left hip fracture. She recieved an ORIF repair.   Clinical Impression   This patient is a 59 year old female who came to [facility] after a fall suffering a left hip fracture. She received an open reduction with internal fixation repair. She had been independent with activities of daily living and functional mobility. She now needs assist and would benefit from Occupational Therapy for ADL/functioal mobility training.    Follow Up Recommendations       Equipment Recommendations       Recommendations for Other Services       Precautions / Restrictions        Mobility Bed Mobility                  Transfers                      Balance                                            ADL                                         General ADL Comments: Previously independent. She now needs assist. Practiced with hip kit to Donned/doffed socks and pants to knees (foley still in place) with hand over hand assist to illustrate technique.     Vision     Perception     Praxis      Pertinent Vitals/Pain Pain Assessment: 0-10 Pain Score: 4      Hand Dominance     Extremity/Trunk Assessment Upper Extremity Assessment Upper Extremity Assessment: Overall WFL for tasks assessed           Communication Communication Communication: No difficulties   Cognition Arousal/Alertness: Awake/alert Behavior During Therapy: WFL for tasks assessed/performed (mildly agitated) Overall Cognitive Status: Within Functional Limits for tasks assessed                     General Comments       Exercises       Shoulder Instructions      Home Living Family/patient  expects to be discharged to:: Private residence Living Arrangements:  (Lives with her mother) Available Help at Discharge: Family Type of Home: House Home Access: Stairs to enter Technical brewer of Steps: 0   Home Layout: One level               Home Equipment: Walker - standard;Tub bench;Bedside commode          Prior Functioning/Environment Level of Independence: Independent             OT Diagnosis: Generalized weakness;Acute pain   OT Problem List: Decreased strength;Decreased activity tolerance   OT Treatment/Interventions:      OT Goals(Current goals can be found in the care plan section) Acute Rehab OT Goals Patient Stated Goal: To go home Time For Goal Achievement: 12/16/14 Potential to Achieve Goals: Good  OT Frequency:     Barriers to D/C:  Co-evaluation              End of Session Equipment Utilized During Treatment:  (hip kit)  Activity Tolerance: Patient tolerated treatment well Patient left: in chair;with call bell/phone within reach;with chair alarm set;with family/visitor present   Time: 7412-8786 OT Time Calculation (min): 18 min Charges:  OT General Charges $OT Visit: 1 Procedure OT Evaluation $Initial OT Evaluation Tier I: 1 Procedure OT Treatments $Self Care/Home Management : 8-22 mins G-Codes:    Myrene Galas, MS/OTR/L  12/02/2014, 10:55 AM

## 2014-12-02 NOTE — Progress Notes (Signed)
Rachael Palmer at [place] NAME: Rachael Palmer    MR#:  [id number]  DATE OF BIRTH:  07/27/1955  SUBJECTIVE:  Feels ok, some pain at surgery site. REVIEW OF SYSTEMS:   Review of Systems  Constitutional: Negative for fever, chills and malaise/fatigue.  HENT: Negative for sore throat.   Eyes: Negative for blurred vision.  Respiratory: Positive for cough and shortness of breath. Negative for hemoptysis, sputum production and wheezing.   Cardiovascular: Negative for chest pain, palpitations and leg swelling.  Gastrointestinal: Negative for nausea, vomiting, abdominal pain, diarrhea and blood in stool.  Genitourinary: Negative for dysuria.  Musculoskeletal: Positive for falls. Negative for back pain.       Left hip pain  Neurological: Positive for weakness. Negative for dizziness, tremors, speech change, focal weakness and headaches.  Endo/Heme/Allergies: Does not bruise/bleed easily.   Tolerating Diet: yes  DRUG ALLERGIES:   Allergies  Allergen Reactions  . Penicillins Hives and Itching   VITALS:  Blood pressure 127/86, pulse 102, temperature 98.2 F (36.8 C), temperature source Oral, resp. rate 20, height 4' (1.219 m), weight 30.845 kg (68 lb), SpO2 99 %. PHYSICAL EXAMINATION:  Physical Exam  Constitutional: She is oriented to person, place, and time. No distress.  Frail  HENT:  Head: Normocephalic.  Eyes: No scleral icterus.  Neck: Normal range of motion. Neck supple. No JVD present. No tracheal deviation present.  Cardiovascular: Normal rate, regular rhythm and normal heart sounds.  Exam reveals no gallop and no friction rub.   No murmur heard. Pulmonary/Chest: Effort normal and breath sounds normal. No respiratory distress. She has no wheezes. She has no rales. She exhibits no tenderness.  Abdominal: Soft. Bowel sounds are normal. She exhibits no distension and no mass. There is no tenderness. There is no rebound and no guarding.   Musculoskeletal: Normal range of motion. She exhibits no edema.  Neurological: She is alert and oriented to person, place, and time.  Skin: Skin is warm. No rash noted. No erythema.  Psychiatric: Affect and judgment normal.   LABORATORY PANEL:   CBC  Recent Labs Lab 12/02/14 0400  WBC 8.3  HGB 9.0*  HCT 24.8*  PLT 329   ------------------------------------------------------------------------------------------------------------------  Chemistries   Recent Labs Lab 12/02/14 0400  NA 133*  K 2.8*  CL 100*  CO2 25  GLUCOSE 112*  BUN <5*  CREATININE 0.52  CALCIUM 7.0*   RADIOLOGY:  Dg Chest 1 View  12/01/2014   IMPRESSION: COPD.  Known right lung mass stable.   Electronically Signed   By: Skipper Cliche M.D.   On: 12/01/2014 08:01   d   By: Lajean Manes M.D.   On: 11/29/2014 18:26   Ct Hip Left Wo Contrast  11/29/2014    IMPRESSION: 1. Mildly displaced, impacted and comminuted intratrochanteric fracture of left proximal femur as detailed above.   Electronically Signed   By: Lajean Manes M.D.   On: 11/29/2014 19:50  ASSESSMENT AND PLAN:   59 year old female with past medical history of COPD, tobacco abuse, alcohol abuse, osteopenia who had a mechanical fall and subsequently has a left hip fracture.  1. Left hip fracture: s/p INTRAMEDULLARY (IM) NAIL FEMORAL, left hip POD 1, pt wants to go home and not rehab.  2. Hyponatremia/Hypokalemia: improving, appreciate nephro input. Na 133, K repleted  3. EtOH abuse: Patient is currently on CIWA protocol which I will continue.  4. Tobacco dependence: Patient was counseled for 3 minutes  regarding tobacco dependence.  5. Essential hypertension: Continue Cardizem  6. COPD 2: No signs of acute exacerbation. Continue Spiriva  7. Lung mass: Patient's PET scan was positive. Patient being followed by Dr. Raul Del. She was having some shortness of breath this morning with a normal lung exam. I ordered a chest x-ray which showed no  evidence of acute pathology. She is not hypoxic.   Management plans discussed with the patient and she is in agreement.   CODE STATUS: Full  TOTAL TIME TAKING CARE OF THIS PATIENT: 25 minutes.   >50% time spent on counselling and coordination of care - Yes  POSSIBLE D/C in 1-2 Days, DEPENDING ON CLINICAL CONDITION.   [facility], Juan Kissoon M.D on 12/02/2014 at 1:38 PM  Between 7am to 6pm - Pager - [phone] After 6pm go to [email or web address] - password [email or web address] [facility]  Office  [phone]  CC: Primary care physician; Kirk Ruths., MD

## 2014-12-02 NOTE — Clinical Social Work Note (Addendum)
Clinical Social Work Assessment  Patient Details  Name: Rachael Palmer MRN: [id number] Date of Birth: [birth year]  Date of referral:  12/02/14               Reason for consult:  Facility Placement, Other (Comment Required) (Patient refusing rehab (SNF) )                Permission sought to share information with:  Family Supports Permission granted to share information::  No  Name::      Patient's mother Rachael Palmer and aunt were at bedside.   Agency::     Relationship::     Contact Information:     Housing/Transportation Living arrangements for the past 2 months:  Single Family Home Source of Information:  Patient, Parent Patient Interpreter Needed:  None Criminal Activity/Legal Involvement Pertinent to Current Situation/Hospitalization:  No - Comment as needed Significant Relationships:  Other(Comment) (Mother and Aunt) Lives with:  Parents, Other (Comment) (Mother ) Do you feel safe going back to the place where you live?  Yes Need for family participation in patient care:  Yes (Comment)  Care giving concerns:  Patient lives with her mother Rachael Palmer in [place].   Social Worker assessment / plan: Holiday representative (CSW) met with patient and her mother Rachael Palmer [phone] and her aunt were at bedside. CSW introduced self and explained role of CSW department. Patient was sitting up in the chair and her mother was combing her hair. CSW explained that PT is recommending SNF. CSW explained the difference between SNF and home health. Patient adamantly refused SNF. Patient and mother are agreeable to home health. Per patient her address is [address], [place], [place]. CSW made RN Case Manager aware of above. Please reconsult if future social work needs arise. CSW signing off.   Employment status:  Disabled (Comment on whether or not currently receiving Disability), Retired Forensic scientist:  Information systems manager, Medicaid In [place] PT Recommendations:  [place] / Referral to community resources:  Other (Comment Required) (Patient refusing SNF is agreeable to home health. )  Patient/Family's Response to care: Patient is refusing SNF and is agreeable to home health.   Patient/Family's Understanding of and Emotional Response to Diagnosis, Current Treatment, and Prognosis: Patient and patient's mother were pleasant throughout assessment.   Emotional Assessment Appearance:  Appears older than stated age Attitude/Demeanor/Rapport:    Affect (typically observed):  Agitated, Blunt Orientation:  Oriented to Self, Oriented to Place, Oriented to  Time, Oriented to Situation Alcohol / Substance use:  Alcohol Use Psych involvement (Current and /or in the community):  No (Comment)  Discharge Needs  Concerns to be addressed:  Denies Needs/Concerns at this time Readmission within the last 30 days:  No Current discharge risk:  Substance Abuse Barriers to Discharge:  Continued Medical Work up   Loralyn Freshwater, LCSW 12/02/2014, 10:58 AM

## 2014-12-02 NOTE — Care Management Important Message (Signed)
Important Message  Patient Details  Name: Rachael Palmer MRN: [id number] Date of Birth: [birth year]   Medicare Important Message Given:  Yes-second notification given    Darius Bump Allmond 12/02/2014, 2:26 PM

## 2014-12-02 NOTE — Evaluation (Signed)
Physical Therapy Evaluation Patient Details Name: Rachael Palmer MRN: [id number] DOB: 10-06-1955 Today's Date: 12/02/2014   History of Present Illness  This patient is a 59 year old female who came to [facility] after a fall, suffering a left hip fracture. She recieved an ORIF repair.  Clinical Impression  Pt presents with hx of asthma, HTN, COPD, lung mass, and alcohol abuse. Examination reveals that pt performs bed mobility with mod assist, transfers with min assist, and ambulation with min assist, but a great deal of cues and encouragement necessary for all mobility. Pt has generalized weakness, c/o pain with mobility, and decreased activity tolerance. With encouragement she will perform the tasks asked of her. Pt will benefit from skilled PT in order to address these deficits in order for her to safely return to her baseline, which is living at home with her mother.     Follow Up Recommendations SNF    Equipment Recommendations  Rolling walker with 5" wheels    Recommendations for Other Services       Precautions / Restrictions        Mobility  Bed Mobility Overal bed mobility: Needs Assistance Bed Mobility: Supine to Sit     Supine to sit: Mod assist     General bed mobility comments: Needs assist to get to EOB and LE managment.   Transfers Overall transfer level: Needs assistance Equipment used: Rolling walker (2 wheeled) Transfers: Sit to/from Stand Sit to Stand: Min assist         General transfer comment: Pt needs assist to get fully into rising. She needs cues to push through walker in order to remain [facility]   Ambulation/Gait Ambulation/Gait assistance: Min assist Ambulation Distance (Feet): 3 Feet (to recliner) Assistive device: Rolling walker (2 wheeled) Gait Pattern/deviations: Step-to pattern;Decreased step length - right;Decreased step length - left;Decreased dorsiflexion - right;Decreased dorsiflexion - left Gait velocity: severely decreased    General Gait  Details: Pt needs heavy cues to sequence walker with gait. She needs min assist for weight shift in order for her to initiate step. Pt needs encouragement to complete task as she frequently states that she cannot do it  Science writer    Modified Rankin (Stroke Patients Only)       Balance Overall balance assessment: History of Falls                                           Pertinent Vitals/Pain Pain Assessment: 0-10 Pain Score: 4  Pain Location: left hip  Pain Intervention(s): Limited activity within patient's tolerance;Monitored during session;Premedicated before session    Home Living Family/patient expects to be discharged to:: Private residence Living Arrangements:  (Lives with mother) Available Help at Discharge: Family;Available 24 hours/day Type of Home: House Home Access: Level entry   Entrance Stairs-Number of Steps: 0 Home Layout: One level Home Equipment: Walker - standard;Tub bench;Bedside commode Additional Comments: Pt lives with mother, states she can have 24 hr care if needed (provided by mother and sisters)    Prior Function Level of Independence: Independent         Comments: Independent with ADLs, ambulation and driving     Hand Dominance        Extremity/Trunk Assessment   Upper Extremity Assessment: Generalized weakness  Lower Extremity Assessment: Generalized weakness         Communication   Communication: No difficulties  Cognition Arousal/Alertness: Awake/alert Behavior During Therapy: Agitated;Anxious Overall Cognitive Status: Within Functional Limits for tasks assessed                      General Comments General comments (skin integrity, edema, etc.): Pt behavior sporadic and unpredictable. Will be pleasant one moment and agitated the next.     Exercises Other Exercises Other Exercises: Pt performed bilateral LE therapeutic exercise x10 reps with min  assist for facilitation of movement. Exercises included: ankle pumps, SAQ, quad sets and glute sets.       Assessment/Plan    PT Assessment Patient needs continued PT services  PT Diagnosis Difficulty walking;Abnormality of gait;Generalized weakness;Acute pain   PT Problem List Decreased strength;Decreased range of motion;Decreased activity tolerance;Decreased balance;Decreased mobility;Decreased knowledge of use of DME;Decreased safety awareness;Decreased knowledge of precautions;Pain  PT Treatment Interventions DME instruction;Stair training;Gait training;Functional mobility training;Therapeutic activities;Therapeutic exercise;Neuromuscular re-education;Balance training;Cognitive remediation;Patient/family education;Manual techniques   PT Goals (Current goals can be found in the Care Plan section) Acute Rehab PT Goals Patient Stated Goal: to go home PT Goal Formulation: With patient Time For Goal Achievement: 12/16/14 Potential to Achieve Goals: Fair    Frequency BID   Barriers to discharge        Co-evaluation               End of Session Equipment Utilized During Treatment: Gait belt Activity Tolerance: Patient limited by pain Patient left: in chair;with call bell/phone within reach;with chair alarm set;with nursing/sitter in room Nurse Communication: Mobility status         Time: 0922-0954 PT Time Calculation (min) (ACUTE ONLY): 32 min   Charges:         PT G CodesJanyth Contes 2014-12-27, 12:38 PM  Janyth Contes, SPT. [phone]

## 2014-12-02 NOTE — Progress Notes (Signed)
Physical Therapy Treatment Patient Details Name: Rachael Palmer MRN: [id number] DOB: 1956/01/22 Today's Date: 12/02/2014    History of Present Illness This patient is a 59 year old female who came to [facility] after a fall, suffering a left hip fracture. She recieved an ORIF repair.    PT Comments    Pt doing much better this afternoon in her attitude and motivation to participate in therapy. She appears to be managing her pain better and is able to perform ambulation and transfers with greater ease. Pt still needs motivation within sessions, but she will continue to benefit from skilled PT in order to eventually return home safely.   Follow Up Recommendations  SNF     Equipment Recommendations  Rolling walker with 5" wheels    Recommendations for Other Services       Precautions / Restrictions Restrictions Weight Bearing Restrictions: Yes (PWB)    Mobility  Bed Mobility Overal bed mobility: Needs Assistance Bed Mobility: Sit to Supine     Supine to sit: Mod assist Sit to supine: Mod assist   General bed mobility comments: Needs assist for lowering of trunk to bed and management of LEs. Pt needs cueing for hand placement during scooting and lowering  Transfers Overall transfer level: Needs assistance Equipment used: Rolling walker (2 wheeled) Transfers: Sit to/from Stand Sit to Stand: Min assist         General transfer comment: Needs min assist to rise into standing but doing so with much less pain this PM. Continues to need cues to use hands to push into walker in order to maintain PWB status   Ambulation/Gait Ambulation/Gait assistance: Min guard Ambulation Distance (Feet): 15 Feet Assistive device: Rolling walker (2 wheeled) Gait Pattern/deviations: Step-through pattern;Decreased step length - right;Decreased step length - left;Decreased dorsiflexion - right;Decreased dorsiflexion - left;Shuffle Gait velocity: decreased   General Gait Details: Pt requires cues to  stay within walker and for sequencing of walker with gait. She has improved this afternoon in the smoothness of her ambulation and her step initiation into a more reciprocal, continuous pattern.    Stairs            Wheelchair Mobility    Modified Rankin (Stroke Patients Only)       Balance Overall balance assessment: History of Falls                                  Cognition Arousal/Alertness: Awake/alert Behavior During Therapy: WFL for tasks assessed/performed Overall Cognitive Status: Within Functional Limits for tasks assessed                      Exercises Other Exercises Other Exercises: Pt performed bilateral LE therapeutic exercise x10 reps with min assist for facilitation of movement. Exercises included: LAQs,ankle pumps, SAQ, quad sets and glute sets.     General Comments General comments (skin integrity, edema, etc.): Pt's attitude better this afternoon. Much more willing to work with therapy       Pertinent Vitals/Pain Pain Assessment: 0-10 Pain Score: 3  Pain Location: left hip Pain Intervention(s): Limited activity within patient's tolerance;Monitored during session;Premedicated before session    [place] expects to be discharged to:: Private residence Living Arrangements:  (Lives with mother) Available Help at Discharge: Family;Available 24 hours/day Type of Home: House Home Access: Level entry   Home Layout: One level   Additional Comments: Pt lives with mother,  states she can have 24 hr care if needed (provided by mother and sisters)    Prior Function Level of Independence: Independent      Comments: Independent with ADLs, ambulation and driving   PT Goals (current goals can now be found in the care plan section) Acute Rehab PT Goals Patient Stated Goal: to go home PT Goal Formulation: With patient Time For Goal Achievement: 12/16/14 Potential to Achieve Goals: Fair Progress towards PT goals:  Progressing toward goals    Frequency  BID    PT Plan      Co-evaluation             End of Session Equipment Utilized During Treatment: Gait belt Activity Tolerance: Patient tolerated treatment well Patient left: in bed;with call bell/phone within reach (Bed alarm not working)     Time: 7824-2353 PT Time Calculation (min) (ACUTE ONLY): 24 min  Charges:                       G CodesJanyth Contes 01-01-2015, 4:54 PM Janyth Contes, SPT. [phone]

## 2014-12-02 NOTE — Progress Notes (Addendum)
Subjective:  Patient doing well postop day #1.  She states that she did well with therapy and has had 2 sessions early today. She is hoping to leave to go home tomorrow. Patient reports pain as mild.  Patient's labs show hyperkalemia.  Objective:   VITALS:   Filed Vitals:   12/02/14 0453 12/02/14 0759 12/02/14 1158 12/02/14 1457  BP: 150/86 156/83 127/86 145/68  Pulse: 103 109 102 86  Temp: 98.6 F (37 C) 98.4 F (36.9 C) 98.2 F (36.8 C) 98 F (36.7 C)  TempSrc: Oral Oral Oral Oral  Resp: '18 16 20 20  '$ Height:      Weight:      SpO2: 95% 95% 99% 100%   Left lower extremity: Lower extremity exam demonstrates patient has Neurovascular intact Sensation intact distally Intact pulses distally Dorsiflexion/Plantar flexion intact Incision: dressing C/D/I No cellulitis present Compartment soft  LABS  Results for orders placed or performed during the hospital encounter of 11/29/14 (from the past 24 hour(s))  CBC     Status: Abnormal   Collection Time: 12/02/14  4:00 AM  Result Value Ref Range   WBC 8.3 3.6 - 11.0 K/uL   RBC 2.36 (L) 3.80 - 5.20 MIL/uL   Hemoglobin 9.0 (L) 12.0 - 16.0 g/dL   HCT 24.8 (L) 35.0 - 47.0 %   MCV 104.7 (H) 80.0 - 100.0 fL   MCH 38.2 (H) 26.0 - 34.0 pg   MCHC 36.5 (H) 32.0 - 36.0 g/dL   RDW 12.7 11.5 - 14.5 %   Platelets 329 150 - 440 K/uL  Basic metabolic panel     Status: Abnormal   Collection Time: 12/02/14  4:00 AM  Result Value Ref Range   Sodium 133 (L) 135 - 145 mmol/L   Potassium 2.8 (LL) 3.5 - 5.1 mmol/L   Chloride 100 (L) 101 - 111 mmol/L   CO2 25 22 - 32 mmol/L   Glucose, Bld 112 (H) 65 - 99 mg/dL   BUN <5 (L) 6 - 20 mg/dL   Creatinine, Ser 0.52 0.44 - 1.00 mg/dL   Calcium 7.0 (L) 8.9 - 10.3 mg/dL   GFR calc non Af Amer >60 >60 mL/min   GFR calc Af Amer >60 >60 mL/min   Anion gap 8 5 - 15  TSH     Status: None   Collection Time: 12/02/14  4:00 AM  Result Value Ref Range   TSH 1.476 0.350 - 4.500 uIU/mL    Dg Chest 1  View  12/01/2014   CLINICAL DATA:  Shortness of breath history of asthma and COPD, patient smokes  EXAM: CHEST  1 VIEW  COMPARISON:  11/29/2014  FINDINGS: Heart size and vascular pattern normal. Hyperinflation suggests COPD. No consolidation effusion or pneumothorax. Known infrahilar right lung mass stable.  IMPRESSION: COPD.  Known right lung mass stable.   Electronically Signed   By: Skipper Cliche M.D.   On: 12/01/2014 08:01   Dg Hip Unilat With Pelvis 1v Left  12/01/2014   CLINICAL DATA:  Post LEFT hip operative repair today  EXAM: DG HIP (WITH OR WITHOUT PELVIS) 1V*L*  COMPARISON:  11/29/2014  FINDINGS: Interval placement of IM nail with proximal compression screw at the LEFT femur post ORIF of a reduced intertrochanteric LEFT femoral fracture.  No dislocation.  Lateral soft tissue gas throughout the thigh.  Distal locking screw present at the IM nail.  IM nail with compression screw at proximal RIGHT femur unchanged.  Symmetric hip and SI joints.  Old healed inferior LEFT pubic ramus fracture.  Scattered atherosclerotic calcification and pelvic the lips.  IMPRESSION: Post ORIF of a reduced intertrochanteric LEFT femoral fracture.   Electronically Signed   By: Lavonia Dana M.D.   On: 12/01/2014 18:25   Dg Hip Operative Unilat With Pelvis Left  12/01/2014   CLINICAL DATA:  LEFT hip surgery  EXAM: OPERATIVE LEFT HIP (WITH PELVIS IF PERFORMED)  VIEWS  TECHNIQUE: Fluoroscopic spot image(s) were submitted for interpretation post-operatively.  FLUOROSCOPY TIME:  0 minutes 49 seconds  Number of images: 4  COMPARISON:  CT LEFT hip 11/29/2014  FINDINGS: Osseous demineralization.  Placement of a long IM nail with proximal compression screw at the proximal LEFT femur post ORIF of a reduced intertrochanteric fracture.  No additional fracture or dislocation.  Distal locking screw present at IM nail.  IMPRESSION: ORIF of an intertrochanteric fracture of LEFT femur.   Electronically Signed   By: Lavonia Dana M.D.   On:  12/01/2014 17:43    Assessment/Plan: 1 Day Post-Op   Active Problems:   Hip fracture   Protein-calorie malnutrition, severe  Patient doing well postop. She had her potassium was repleted today.  I explained to her that I want her to perform more physical therapy in the morning. The physical therapist will help determine if she is ready for discharge tomorrow. Patient's pain is well-controlled.  Recheck labs in the morning. She is encouraged to continue to use incentive spirometry. She has completed 24 hours of postop antibiotics.    Thornton Park , MD 12/02/2014, 4:11 PM

## 2014-12-03 LAB — BASIC METABOLIC PANEL
ANION GAP: 7 (ref 5–15)
BUN: 5 mg/dL — AB (ref 6–20)
CHLORIDE: 98 mmol/L — AB (ref 101–111)
CO2: 26 mmol/L (ref 22–32)
Calcium: 8 mg/dL — ABNORMAL LOW (ref 8.9–10.3)
Creatinine, Ser: 0.46 mg/dL (ref 0.44–1.00)
GFR calc Af Amer: >60 mL/min (ref >60)
GFR calc non Af Amer: >60 mL/min (ref >60)
Glucose, Bld: 98 mg/dL (ref 65–99)
Potassium: 4.1 mmol/L (ref 3.5–5.1)
SODIUM: 131 mmol/L — AB (ref 135–145)

## 2014-12-03 LAB — CBC
HEMATOCRIT: 27.1 % — AB (ref 35.0–47.0)
HEMOGLOBIN: 9.3 g/dL — AB (ref 12.0–16.0)
MCH: 36 pg — AB (ref 26.0–34.0)
MCHC: 34.4 g/dL (ref 32.0–36.0)
MCV: 104.8 fL — ABNORMAL HIGH (ref 80.0–100.0)
Platelets: 380 10*3/uL (ref 150–440)
RBC: 2.58 MIL/uL — AB (ref 3.80–5.20)
RDW: 12.6 % (ref 11.5–14.5)
WBC: 6.3 10*3/uL (ref 3.6–11.0)

## 2014-12-03 LAB — MAGNESIUM: Magnesium: 1.4 mg/dL — ABNORMAL LOW (ref 1.7–2.4)

## 2014-12-03 MED ORDER — DOCUSATE SODIUM 100 MG PO CAPS: 100.0000 mg | ORAL_CAPSULE | Freq: Two times a day (BID) | ORAL | Status: DC

## 2014-12-03 MED ORDER — MAGNESIUM SULFATE 4 GM/100ML IV SOLN
4.0000 g | Freq: Once | INTRAVENOUS | Status: AC
  Administered 2014-12-03: 4 g via INTRAVENOUS
  Filled 2014-12-03: qty 100

## 2014-12-03 MED ORDER — TRAMADOL HCL 50 MG PO TABS: 50.0000 mg | ORAL_TABLET | Freq: Three times a day (TID) | ORAL | Status: DC | PRN

## 2014-12-03 MED ORDER — ENOXAPARIN SODIUM 30 MG/0.3ML ~~LOC~~ SOLN: 30.0000 mg | Freq: Every day | SUBCUTANEOUS | Status: DC

## 2014-12-03 MED ORDER — OXYCODONE-ACETAMINOPHEN 5-325 MG PO TABS: 0.5000 | ORAL_TABLET | Freq: Three times a day (TID) | ORAL | Status: DC | PRN

## 2014-12-03 NOTE — Care Management (Addendum)
Patient discharging POD 2; refusing SNF. She wants to return home with her mother and sister. Sister to bring in walker for PT to evaluation. Still refusing front-wheeled walker although she is now agreeing to HHPT without preference. Corene Cornea with [facility] notified of patient discharge today. Family requesting rolling walker; Walker delivered by Will with [facility]. No further RNCM needs. Case closed.

## 2014-12-03 NOTE — Discharge Instructions (Addendum)
Hip Fracture A hip fracture is a fracture of the upper part of your thigh bone (femur).  CAUSES A hip fracture is caused by a direct blow to the side of your hip. This is usually the result of a fall but can occur in other circumstances, such as an automobile accident. RISK FACTORS There is an increased risk of hip fractures in people with:  An unsteady walking pattern (gait) and those with conditions that contribute to poor balance, such as Parkinson's disease or dementia.  Osteopenia and osteoporosis.  Cancer that spreads to the leg bones.  Certain metabolic diseases. SYMPTOMS  Symptoms of hip fracture include:  Pain over the injured hip.  Inability to put weight on the leg in which the fracture occurred (although, some patients are able to walk after a hip fracture).  Toes and foot of the affected leg point outward when you lie down. DIAGNOSIS A physical exam can determine if a hip fracture is likely to have occurred. X-ray exams are needed to confirm the fracture and to look for other injuries. The X-ray exam can help to determine the type of hip fracture. Rarely, the fracture is not visible on an X-ray image and a CT scan or MRI will have to be done. TREATMENT  The treatment for a fracture is usually surgery. This means using a screw, nail, or rod to hold the bones in place.  HOME CARE INSTRUCTIONS Take all medicines as directed by your health care provider. SEEK MEDICAL CARE IF: Pain continues, even after taking pain medicine. MAKE SURE YOU:  Understand these instructions.   Will watch your condition.  Will get help right away if you are not doing well or get worse. Document Released: 04/12/2005 Document Revised: 04/17/2013 Document Reviewed: 11/22/2012 [facility] [facility] Patient Information 2015 [place], [place]. This information is not intended to replace advice given to you by your health care provider. Make sure you discuss any questions you have with your health care  provider.   Orthopedic discharge instructions: You must remain partial weightbearing on the left lower extremity for a total of 4 weeks following her operation. Use a rolling walker when standing or walking. A rolling walker has been ordered for you. He will need to have Lovenox administered 40 mg per day to prevent blood clots for a total of 4 weeks after your surgery. Continue to rest and elevate the left lower extremity whenever possible. You may apply ice over the left hip to help reduce swelling. Contact the surgeon's office with any concerns over her redness, swelling, numbness or tingling, severe pain or fevers or chills. He will follow-up with the surgeon in his office in 10-14 days so he can check her incision and an x-ray in his office.   Sister to administer  the lovenox injection at home, Lovenox injection education given to sister Mariann Laster.  Keep the dressing dry/clean/intact to the right hip.

## 2014-12-03 NOTE — Progress Notes (Signed)
Physical Therapy Treatment Patient Details Name: Rachael Palmer MRN: [id number] DOB: [birth year] Today's Date: 12/03/2014    History of Present Illness This patient is a 59 year old female who came to [facility] after a fall, suffering a left hip fracture. She recieved an ORIF repair.    PT Comments    Pt making better progress towards goals this AM and continues to improve her motivation and outlook on therapy. It was made clear that pt will need HHPT in order to go home, as well as proper assistance from family members while at home. Pt able to perform transfers at supervision and ambulation at [facility] with no instability or LOB. Pt demonstrated ability to walk necessary household distances with RW as well as navigate the two stairs necessary to get into her home. Pt has good family support that are motivated to monitor and help her adhere to PT recommendations. Pt will continue to benefit from skilled PT in order to address her strength and mobility deficits to help her return home safely.   Follow Up Recommendations  Home health PT;Supervision/Assistance - 24 hour     Equipment Recommendations  Rolling walker with 5" wheels    Recommendations for Other Services       Precautions / Restrictions Restrictions Weight Bearing Restrictions: Yes (PWB)    Mobility  Bed Mobility               General bed mobility comments: Pt found in recliner; not assessed this date  Transfers Overall transfer level: Needs assistance Equipment used: Rolling walker (2 wheeled) Transfers: Sit to/from Stand Sit to Stand: Supervision         General transfer comment: Pt needs cues to not pull on walker when standing up. Still requires cues to push through walker in standing in order to maintain PWB status   Ambulation/Gait Ambulation/Gait assistance: Min guard Ambulation Distance (Feet): 80 Feet Assistive device: Rolling walker (2 wheeled) Gait Pattern/deviations: Step-through pattern;Decreased step  length - left;Decreased step length - right Gait velocity: decreased   General Gait Details: Pt ambulating with RW better today. She requires cues to push through walker to maintain PWB status as well as cues for symmetrical gait at times (step lengths vary)    Stairs Stairs: Yes Stairs assistance: Min assist Stair Management: One rail Left (RW on right side for support to maintain PWB) Number of Stairs: 4 (only has 2 at home) General stair comments: Pt requires assistance to advance RW as well as CGA for safety. Needs cueing on how to use hands to maintain PWB status   Wheelchair Mobility    Modified Rankin (Stroke Patients Only)       Balance Overall balance assessment: History of Falls                                  Cognition Arousal/Alertness: Awake/alert Behavior During Therapy: WFL for tasks assessed/performed Overall Cognitive Status: Within Functional Limits for tasks assessed                      Exercises Other Exercises Other Exercises: Pt performed bilateral LE therapeutic exercise x12 reps with supervision for proper technique. Exercises included: ankle pumps, LAQ, quad sets and glute sets. Exercise packet issued for d/c and pt/family state understanding of therex program      General Comments General comments (skin integrity, edema, etc.): Pt states commitment to quit drinking alcohol and  smoking      Pertinent Vitals/Pain Pain Assessment: 0-10 Pain Score: 2  Pain Location: L hip Pain Intervention(s): Limited activity within patient's tolerance;Monitored during session;Premedicated before session    Home Living                      Prior Function            PT Goals (current goals can now be found in the care plan section) Acute Rehab PT Goals Patient Stated Goal: to go home PT Goal Formulation: With patient Time For Goal Achievement: 12/16/14 Potential to Achieve Goals: Fair Progress towards PT goals: Progressing  toward goals    Frequency  BID    PT Plan Discharge plan needs to be updated    Co-evaluation             End of Session Equipment Utilized During Treatment: Gait belt Activity Tolerance: Patient tolerated treatment well Patient left: in chair;with call bell/phone within reach;with chair alarm set;with family/visitor present;with nursing/sitter in room     Time: 1006-1046 PT Time Calculation (min) (ACUTE ONLY): 40 min  Charges:                       G CodesJanyth Contes 21-Dec-2014, 11:05 AM Janyth Contes, SPT. [phone]

## 2014-12-03 NOTE — Progress Notes (Addendum)
Subjective:  Postoperative day #2 status post left intertrochanteric hip fracture.  Patient reports pain as mild.  Patient wants to go home today. She has refused SNF, despite physical therapy recommendations. I had also recommended this to the patient that she wishes to go home and states that she has family support there. She has had a previous right intertrochanteric hip fracture and went home postoperatively after that operation as well. She states she did well at home following her right hip surgery.  Patient's had a bowel movement. She has no other complaints.  Objective:   VITALS:   Filed Vitals:   12/02/14 1457 12/02/14 2001 12/03/14 0012 12/03/14 0739  BP: 145/68 150/84 161/96 149/92  Pulse: 86 89 99 96  Temp: 98 F (36.7 C) 98.1 F (36.7 C) 98.1 F (36.7 C) 98.2 F (36.8 C)  TempSrc: Oral Oral Oral Oral  Resp: '20 18 18 16  '$ Height:      Weight:      SpO2: 100%  100% 99%    Neurovascular intact Sensation intact distally Intact pulses distally Dorsiflexion/Plantar flexion intact Incision: dressing C/D/I No cellulitis present Compartment soft  LABS  Results for orders placed or performed during the hospital encounter of 11/29/14 (from the past 24 hour(s))  Basic metabolic panel     Status: Abnormal   Collection Time: 12/03/14  5:48 AM  Result Value Ref Range   Sodium 131 (L) 135 - 145 mmol/L   Potassium 4.1 3.5 - 5.1 mmol/L   Chloride 98 (L) 101 - 111 mmol/L   CO2 26 22 - 32 mmol/L   Glucose, Bld 98 65 - 99 mg/dL   BUN 5 (L) 6 - 20 mg/dL   Creatinine, Ser 0.46 0.44 - 1.00 mg/dL   Calcium 8.0 (L) 8.9 - 10.3 mg/dL   GFR calc non Af Amer >60 >60 mL/min   GFR calc Af Amer >60 >60 mL/min   Anion gap 7 5 - 15  Magnesium     Status: Abnormal   Collection Time: 12/03/14  5:48 AM  Result Value Ref Range   Magnesium 1.4 (L) 1.7 - 2.4 mg/dL  CBC     Status: Abnormal   Collection Time: 12/03/14  5:48 AM  Result Value Ref Range   WBC 6.3 3.6 - 11.0 K/uL   RBC 2.58  (L) 3.80 - 5.20 MIL/uL   Hemoglobin 9.3 (L) 12.0 - 16.0 g/dL   HCT 27.1 (L) 35.0 - 47.0 %   MCV 104.8 (H) 80.0 - 100.0 fL   MCH 36.0 (H) 26.0 - 34.0 pg   MCHC 34.4 32.0 - 36.0 g/dL   RDW 12.6 11.5 - 14.5 %   Platelets 380 150 - 440 K/uL    Dg Hip Unilat With Pelvis 1v Left  12/01/2014   CLINICAL DATA:  Post LEFT hip operative repair today  EXAM: DG HIP (WITH OR WITHOUT PELVIS) 1V*L*  COMPARISON:  11/29/2014  FINDINGS: Interval placement of IM nail with proximal compression screw at the LEFT femur post ORIF of a reduced intertrochanteric LEFT femoral fracture.  No dislocation.  Lateral soft tissue gas throughout the thigh.  Distal locking screw present at the IM nail.  IM nail with compression screw at proximal RIGHT femur unchanged.  Symmetric hip and SI joints.  Old healed inferior LEFT pubic ramus fracture.  Scattered atherosclerotic calcification and pelvic the lips.  IMPRESSION: Post ORIF of a reduced intertrochanteric LEFT femoral fracture.   Electronically Signed   By: Crist Infante.D.  On: 12/01/2014 18:25   Dg Hip Operative Unilat With Pelvis Left  12/01/2014   CLINICAL DATA:  LEFT hip surgery  EXAM: OPERATIVE LEFT HIP (WITH PELVIS IF PERFORMED)  VIEWS  TECHNIQUE: Fluoroscopic spot image(s) were submitted for interpretation post-operatively.  FLUOROSCOPY TIME:  0 minutes 49 seconds  Number of images: 4  COMPARISON:  CT LEFT hip 11/29/2014  FINDINGS: Osseous demineralization.  Placement of a long IM nail with proximal compression screw at the proximal LEFT femur post ORIF of a reduced intertrochanteric fracture.  No additional fracture or dislocation.  Distal locking screw present at IM nail.  IMPRESSION: ORIF of an intertrochanteric fracture of LEFT femur.   Electronically Signed   By: Lavonia Dana M.D.   On: 12/01/2014 17:43    Assessment/Plan: 2 Days Post-Op   Active Problems:   Hip fracture   Protein-calorie malnutrition, severe  Patient has been deemed safe for discharge home today  by the physical therapy service. Her pain is well-controlled. She has no other acute postoperative issues. I had a frank conversation with the patient regarding her history of alcohol abuse. I recommended the patient. The use of cigarettes and alcohol at home. She understands that alcohol intake at home may interact with her pain medications and made to an overdose. She also understands that alcohol may affect her balance and causes her to fall. She risks further and potentially worse injury including head injury. Patient also understands of cigarettes will affect her bone healing. Patient will follow up with me in clinic in approximately 10-14 days for wound check and staple removal. I spoke with the patient's sister and mother as well. The patient will be staying with her mother upon discharge. They understood the above information as well. They have agreed to take her home and will provide care for her there. Patient has been cleared by the medical service for discharge as well. She will follow-up with her primary care physician, Dr. Ouida Sills, regarding her recent hyponatremia and hypokalemia.    Thornton Park , MD 12/03/2014, 11:02 AM

## 2014-12-04 NOTE — Discharge Summary (Signed)
[facility] NAME: Rachael Palmer    MR#:  [id number]  DATE OF BIRTH:  Feb 15, 1956  DATE OF ADMISSION:  11/29/2014 ADMITTING PHYSICIAN: Henreitta Leber, MD  DATE OF DISCHARGE: 12/03/2014  1:47 PM  PRIMARY CARE PHYSICIAN: Kirk Ruths., MD    ADMISSION DIAGNOSIS:  Hyponatremia [E87.1] Hip fracture requiring operative repair [F09.323F] Intertrochanteric fracture, closed, left, initial encounter [S72.142A]  DISCHARGE DIAGNOSIS:  Active Problems:   Hip fracture   Protein-calorie malnutrition, severe   SECONDARY DIAGNOSIS:   Past Medical History  Diagnosis Date  . Asthma   . Hypertension   . Lung mass   . COPD (chronic obstructive pulmonary disease)   . Alcohol abuse    HOSPITAL COURSE:  59 y.o. female with a known history of hypertension, COPD, lung mass suspicious for cancer, ongoing tobacco abuse, alcohol abuse, who presents to the hospital after a fall and noted to have a left hip fracture. Please see Dr Edward Jolly dictated [facility] for further details. She was evaluated by Orthopedics Dr Mack Guise who recommended surgery which she underwent without any immediate complications. She was also noted to have Hyponatremia for which Nephrology consult was obtained who recommended stopping diuretic and adjusted IVF's. Her Na was improved by the day of discharge.  1. Left hip fracture: s/p INTRAMEDULLARY (IM) NAIL FEMORAL.  2. Hyponatremia/Hypokalemia: improving, appreciate nephro input. Na 133, K repleted  She was evaluted by PT who recommended STR/SNF but patient refused and just wanted to go home. She was set up to receive HHPT. She was feeling much better by 9th Aug and was discharged home in stable condition. DISCHARGE CONDITIONS:  Stable CONSULTS OBTAINED:  Treatment Team:  Thornton Park, MD Lavonia Dana, MD DRUG ALLERGIES:   Allergies  Allergen Reactions  . Penicillins Hives and Itching   DISCHARGE  MEDICATIONS:   Discharge Medication List as of 12/03/2014 12:56 PM    START taking these medications   Details  docusate sodium (COLACE) 100 MG capsule Take 1 capsule (100 mg total) by mouth 2 (two) times daily., Starting 12/03/2014, Until Discontinued, Normal    enoxaparin (LOVENOX) 30 MG/0.3ML injection Inject 0.3 mLs (30 mg total) into the skin daily., Starting 12/03/2014, Until Discontinued, Normal      CONTINUE these medications which have CHANGED   Details  oxyCODONE-acetaminophen (PERCOCET/ROXICET) 5-325 MG per tablet Take 0.5-1 tablets by mouth 3 (three) times daily as needed for severe pain., Starting 12/03/2014, Until Discontinued, Print    traMADol (ULTRAM) 50 MG tablet Take 1 tablet (50 mg total) by mouth every 8 (eight) hours as needed for moderate pain., Starting 12/03/2014, Until Discontinued, Print      CONTINUE these medications which have NOT CHANGED   Details  albuterol (PROVENTIL HFA;VENTOLIN HFA) 108 (90 BASE) MCG/ACT inhaler Inhale 2 puffs into the lungs every 4 (four) hours as needed for wheezing or shortness of breath., Until Discontinued, Historical Med    alendronate (FOSAMAX) 70 MG tablet Take 70 mg by mouth once a week. Pt takes on Tuesday., Until Discontinued, Historical Med    aspirin 81 MG chewable tablet Chew 81 mg by mouth daily., Until Discontinued, Historical Med    carisoprodol (SOMA) 350 MG tablet Take 350 mg by mouth 3 (three) times daily as needed for muscle spasms., Until Discontinued, Historical Med    diltiazem (CARDIZEM) 60 MG tablet Take 60 mg by mouth 2 (two) times daily., Until Discontinued, Historical Med    mirtazapine (REMERON)  7.5 MG tablet Take 7.5 mg by mouth at bedtime., Until Discontinued, Historical Med    potassium chloride (K-DUR) 10 MEQ tablet Take 10 mEq by mouth daily., Until Discontinued, Historical Med    predniSONE (DELTASONE) 20 MG tablet Take 20 mg by mouth daily., Until Discontinued, Historical Med    tiotropium (SPIRIVA) 18  MCG inhalation capsule Place 18 mcg into inhaler and inhale daily., Until Discontinued, Historical Med    torsemide (DEMADEX) 5 MG tablet Take 5 mg by mouth daily., Until Discontinued, Historical Med    traZODone (DESYREL) 100 MG tablet Take 100 mg by mouth at bedtime., Until Discontinued, Historical Med       DISCHARGE INSTRUCTIONS:   DIET:  Cardiac diet DISCHARGE CONDITION:  Good ACTIVITY:  Activity as tolerated OXYGEN:  Home Oxygen: No.  Oxygen Delivery: room air DISCHARGE LOCATION:  Home with home health   If you experience worsening of your admission symptoms, develop shortness of breath, life threatening emergency, suicidal or homicidal thoughts you must seek medical attention immediately by calling 911 or calling your MD immediately  if symptoms less severe.  You Must read complete instructions/literature along with all the possible adverse reactions/side effects for all the Medicines you take and that have been prescribed to you. Take any new Medicines after you have completely understood and accpet all the possible adverse reactions/side effects.   Please note  You were cared for by a hospitalist during your hospital stay. If you have any questions about your discharge medications or the care you received while you were in the hospital after you are discharged, you can call the unit and asked to speak with the hospitalist on call if the hospitalist that took care of you is not available. Once you are discharged, your primary care physician will handle any further medical issues. Please note that NO REFILLS for any discharge medications will be authorized once you are discharged, as it is imperative that you return to your primary care physician (or establish a relationship with a primary care physician if you do not have one) for your aftercare needs so that they can reassess your need for medications and monitor your lab values.   On the day of Discharge:  VITAL SIGNS:   Blood pressure 128/73, pulse 99, temperature 98.2 F (36.8 C), temperature source Oral, resp. rate 16, height 4' (1.219 m), weight 30.845 kg (68 lb), SpO2 99 %. PHYSICAL EXAMINATION:  GENERAL:  59 y.o.-year-old patient lying in the bed with no acute distress.  EYES: Pupils equal, round, reactive to light and accommodation. No scleral icterus. Extraocular muscles intact.  HEENT: Head atraumatic, normocephalic. Oropharynx and nasopharynx clear.  NECK:  Supple, no jugular venous distention. No thyroid enlargement, no tenderness.  LUNGS: Normal breath sounds bilaterally, no wheezing, rales,rhonchi or crepitation. No use of accessory muscles of respiration.  CARDIOVASCULAR: S1, S2 normal. No murmurs, rubs, or gallops.  ABDOMEN: Soft, non-tender, non-distended. Bowel sounds present. No organomegaly or mass.  EXTREMITIES: No pedal edema, cyanosis, or clubbing. Surgical Incision: dressing C/D/I NEUROLOGIC: Cranial nerves II through XII are intact. Muscle strength 5/5 in all extremities. Sensation intact. Gait not checked.  PSYCHIATRIC: The patient is alert and oriented x 3.  SKIN: No obvious rash, lesion, or ulcer.  DATA REVIEW:   CBC  Recent Labs Lab 12/03/14 0548  WBC 6.3  HGB 9.3*  HCT 27.1*  PLT 380    Chemistries   Recent Labs Lab 12/03/14 0548  NA 131*  K 4.1  CL  98*  CO2 26  GLUCOSE 98  BUN 5*  CREATININE 0.46  CALCIUM 8.0*  MG 1.4*    Management plans discussed with the patient, family and they are in agreement.  CODE STATUS: Full  TOTAL TIME TAKING CARE OF THIS PATIENT: 55 minutes.    [facility], Charita Lindenberger M.D on 12/04/2014 at 10:15 PM  Between 7am to 6pm - Pager - [phone]  After 6pm go to [email or web address] - password [email or web address] [facility]  Office  [phone]  CC: Primary care physician; Kirk Ruths., MD Thornton Park , MD

## 2014-12-04 NOTE — Care Management (Signed)
Post discharge: Received notification from [facility] that they were not able to get in contact with patient to start care. I contacted patient at [phone] and her mother answered the phone. She said she had not received any calls from home health. She said home health could start any day after noon- she's a late sleeper. Corene Cornea with [facility] notified.

## 2014-12-05 ENCOUNTER — Ambulatory Visit: Payer: Medicare Other | Admitting: Oncology

## 2014-12-10 ENCOUNTER — Inpatient Hospital Stay: Payer: Medicare Other | Attending: Oncology | Admitting: Oncology

## 2014-12-10 ENCOUNTER — Encounter: Payer: Self-pay | Admitting: *Deleted

## 2014-12-10 VITALS — BP 174/75 | HR 84 | Temp 96.5°F

## 2014-12-10 DIAGNOSIS — M818 Other osteoporosis without current pathological fracture: Secondary | ICD-10-CM | POA: Diagnosis not present

## 2014-12-10 DIAGNOSIS — F419 Anxiety disorder, unspecified: Secondary | ICD-10-CM

## 2014-12-10 DIAGNOSIS — Z7982 Long term (current) use of aspirin: Secondary | ICD-10-CM | POA: Diagnosis not present

## 2014-12-10 DIAGNOSIS — M4850XS Collapsed vertebra, not elsewhere classified, site unspecified, sequela of fracture: Secondary | ICD-10-CM

## 2014-12-10 DIAGNOSIS — F329 Major depressive disorder, single episode, unspecified: Secondary | ICD-10-CM | POA: Diagnosis not present

## 2014-12-10 DIAGNOSIS — J449 Chronic obstructive pulmonary disease, unspecified: Secondary | ICD-10-CM | POA: Insufficient documentation

## 2014-12-10 DIAGNOSIS — S72002S Fracture of unspecified part of neck of left femur, sequela: Secondary | ICD-10-CM | POA: Diagnosis not present

## 2014-12-10 DIAGNOSIS — I700 Atherosclerosis of aorta: Secondary | ICD-10-CM | POA: Diagnosis not present

## 2014-12-10 DIAGNOSIS — Z7952 Long term (current) use of systemic steroids: Secondary | ICD-10-CM | POA: Insufficient documentation

## 2014-12-10 DIAGNOSIS — Z79899 Other long term (current) drug therapy: Secondary | ICD-10-CM | POA: Diagnosis not present

## 2014-12-10 DIAGNOSIS — R05 Cough: Secondary | ICD-10-CM | POA: Diagnosis not present

## 2014-12-10 DIAGNOSIS — R918 Other nonspecific abnormal finding of lung field: Secondary | ICD-10-CM | POA: Insufficient documentation

## 2014-12-10 DIAGNOSIS — J45909 Unspecified asthma, uncomplicated: Secondary | ICD-10-CM | POA: Diagnosis not present

## 2014-12-10 DIAGNOSIS — F17210 Nicotine dependence, cigarettes, uncomplicated: Secondary | ICD-10-CM | POA: Diagnosis not present

## 2014-12-10 DIAGNOSIS — I10 Essential (primary) hypertension: Secondary | ICD-10-CM | POA: Insufficient documentation

## 2014-12-10 DIAGNOSIS — E119 Type 2 diabetes mellitus without complications: Secondary | ICD-10-CM | POA: Diagnosis not present

## 2014-12-10 DIAGNOSIS — F1021 Alcohol dependence, in remission: Secondary | ICD-10-CM | POA: Diagnosis not present

## 2014-12-10 DIAGNOSIS — R079 Chest pain, unspecified: Secondary | ICD-10-CM | POA: Insufficient documentation

## 2014-12-10 NOTE — Progress Notes (Signed)
Patient does not have living will.  Smokes about 3-4 cigarettes a day.  Patient new evaluation today.  Hip fracture on left side.

## 2014-12-10 NOTE — Progress Notes (Signed)
Met with patient, mother, and sister at medical oncology consultation appointment. Introduced Programmer, multimedia and reviewed plan of care. Will follow.

## 2014-12-11 ENCOUNTER — Encounter: Payer: Self-pay | Admitting: Oncology

## 2014-12-11 DIAGNOSIS — R918 Other nonspecific abnormal finding of lung field: Secondary | ICD-10-CM

## 2014-12-11 HISTORY — DX: Other nonspecific abnormal finding of lung field: R91.8

## 2014-12-11 NOTE — Progress Notes (Signed)
[place] @ [facility] [phone]  Fax:([phone]) [place]  Rachael Palmer OB: January 31, 1956  MR#: [id number]  CWC#:[id number]  Patient Care Team: Kirk Ruths, MD as PCP - General (Internal Medicine)  CHIEF COMPLAINT:  Chief Complaint  Patient presents with  . New Evaluation    VISIT DIAGNOSIS:     ICD-9-CM ICD-10-CM   1. Mass of lower lobe of right lung 786.6 J98.4       No history exists.    Oncology Flowsheet 11/29/2014 11/30/2014 12/02/2014 12/03/2014  LORazepam (ATIVAN) IV 1 mg - - -  ondansetron (ZOFRAN) IV 4 mg - - -  predniSONE (DELTASONE) PO - 20 mg 20 mg 20 mg    INTERVAL HISTORY: 59 year old lady who had been a chronic smoker and continues to smoke started having cough.  Patient underwent initial evaluation with CT scan of the chest which revealed spiculated right lower lobe lung mass.  Patient underwent PET scan PET scan and right lower lobe lung mass was FDG positive.  No mediastinal adenopathy.  During evaluation patient developed left hip fracture.  Patient fell and broke her left hip underwent surgery on Lovenox undergoing rehabilitation therapy area patient has alsosevere osteoporosis and multiple vertebral compression fracture.  Also has history of COPD and diabetes.  Patient was referred to me for further evaluation regarding right lower lobe lung mass which was PET positive.  She denies any hemoptysis.  Dry hacking cough.  Occasional chest pain. REVIEW OF SYSTEMS:   Gen. status: Patient is in wheelchair because of left hip fracture.  HEENT: Denies any headache or dizziness.  No soreness in the mouth.  Musculoskeletal system multiple vertebral compression fracture MRI scan was done which revealed osteoporotic compression fracture.  Lungs: Patient has COPD.  Not on her neck oxygen at present time.  Chronic smoker.  No hemoptysis.  No chest pain. Cardiac: No paroxysmal nocturnal dyspnea and no coronary artery disease. GI: No nausea no vomiting  or diarrhea appetite remains stable.  No rectal bleeding. Lower extremity no edema. Skin: No rash. Psychiatric system: Anxiety and depression.  As per HPI. Otherwise, a complete review of systems is negatve.  PAST MEDICAL HISTORY: Past Medical History  Diagnosis Date  . Asthma   . Hypertension   . Lung mass   . COPD (chronic obstructive pulmonary disease)   . Alcohol abuse   . Mass of lower lobe of right lung 12/11/2014    PAST SURGICAL HISTORY: Past Surgical History  Procedure Laterality Date  . Hip surgery    . Femur im nail Left 12/01/2014    Procedure: INTRAMEDULLARY (IM) NAIL FEMORAL;  Surgeon: Thornton Park, MD;  Location: [facility] ORS;  Service: Orthopedics;  Laterality: Left;    FAMILY HISTORY Family History  Problem Relation Age of Onset  . Hypertension Mother   . Cirrhosis Father      ADVANCED DIRECTIVES:  Patient does not have any living will or healthcare power of attorney.  Information was given .  Available resources had been discussed.  We will follow-up on subsequent appointments regarding this issue  HEALTH MAINTENANCE: Social History  Substance Use Topics  . Smoking status: Current Every Day Smoker -- 0.25 packs/day for 40 years    Types: Cigarettes  . Smokeless tobacco: None  . Alcohol Use: 12.0 oz/week    20 Cans of beer per week      Allergies  Allergen Reactions  . Penicillins Hives, Itching and Rash    Current Outpatient Prescriptions  Medication Sig Dispense Refill  . albuterol (PROVENTIL HFA;VENTOLIN HFA) 108 (90 BASE) MCG/ACT inhaler Inhale 2 puffs into the lungs every 4 (four) hours as needed for wheezing or shortness of breath.    Marland Kitchen alendronate (FOSAMAX) 70 MG tablet Take 70 mg by mouth once a week. Pt takes on Tuesday.    . calcium-vitamin D (OSCAL WITH D) 500-200 MG-UNIT per tablet Take 1 tablet by mouth 2 (two) times daily.    . carisoprodol (SOMA) 350 MG tablet Take 350 mg by mouth 3 (three) times daily as needed for muscle spasms.     Marland Kitchen diltiazem (CARDIZEM) 60 MG tablet Take 60 mg by mouth 2 (two) times daily.    Marland Kitchen enoxaparin (LOVENOX) 30 MG/0.3ML injection Inject 0.3 mLs (30 mg total) into the skin daily. 12 Syringe 0  . mirtazapine (REMERON) 7.5 MG tablet Take 7.5 mg by mouth at bedtime.    Marland Kitchen oxyCODONE-acetaminophen (PERCOCET/ROXICET) 5-325 MG per tablet Take 0.5-1 tablets by mouth 3 (three) times daily as needed for severe pain. 30 tablet 0  . potassium chloride (K-DUR) 10 MEQ tablet Take 10 mEq by mouth daily.    Marland Kitchen tiotropium (SPIRIVA) 18 MCG inhalation capsule Place 18 mcg into inhaler and inhale daily.    Marland Kitchen torsemide (DEMADEX) 5 MG tablet Take 5 mg by mouth daily.    . traMADol (ULTRAM) 50 MG tablet Take 1 tablet (50 mg total) by mouth every 8 (eight) hours as needed for moderate pain. 30 tablet 0  . traZODone (DESYREL) 100 MG tablet Take 100 mg by mouth at bedtime.    Marland Kitchen aspirin 81 MG chewable tablet Chew 81 mg by mouth daily.    Marland Kitchen docusate sodium (COLACE) 100 MG capsule Take 1 capsule (100 mg total) by mouth 2 (two) times daily. (Patient not taking: Reported on 12/10/2014) 10 capsule 0  . predniSONE (DELTASONE) 20 MG tablet Take 20 mg by mouth daily.     No current facility-administered medications for this visit.    OBJECTIVE: PHYSICAL EXAM: GENERAL:  Well developed, well nourished, sitting comfortably in the exam room in no acute distress. MENTAL STATUS:  Alert and oriented to person, place and time. . ENT:  Oropharynx clear without lesion.  Tongue normal. Mucous membranes moist.  RESPIRATORY:  Clear to auscultation without rales, wheezes or rhonchi. CARDIOVASCULAR:  Regular rate and rhythm without murmur, rub or gallop.  ABDOMEN:  Soft, non-tender, with active bowel sounds, and no hepatosplenomegaly.  No masses. BACK:  No CVA tenderness.  No tenderness on percussion of the back or rib cage. SKIN:  No rashes, ulcers or lesions. EXTREMITIES: No edema, no skin discoloration or tenderness.  No palpable cords.   Left hip fracture which is healing. LYMPH NODES: No palpable cervical, supraclavicular, axillary or inguinal adenopathy  NEUROLOGICAL: Unremarkable. PSYCH:  Appropriate.  Filed Vitals:   12/10/14 1159  BP: 174/75  Pulse: 84  Temp: 96.5 F (35.8 C)     There is no weight on file to calculate BMI.    ECOG FS:2 - Symptomatic, <50% confined to bed  LAB RESULTS:  No visits with results within 5 Day(s) from this visit. Latest known visit with results is:  Admission on 11/29/2014, Discharged on 12/03/2014  Component Date Value Ref Range Status  . Sodium 11/29/2014 116* 135 - 145 mmol/L Final   Comment: CRITICAL RESULT CALLED TO, READ BACK BY AND VERIFIED WITH AMANDA LOVETT AT [place] ON 11/29/14 RWW   . Potassium 11/29/2014 3.8  3.5 - 5.1  mmol/L Final  . Chloride 11/29/2014 73* 101 - 111 mmol/L Final  . CO2 11/29/2014 29  22 - 32 mmol/L Final  . Glucose, Bld 11/29/2014 73  65 - 99 mg/dL Final  . BUN 11/29/2014 <5* 6 - 20 mg/dL Final  . Creatinine, Ser 11/29/2014 0.36* 0.44 - 1.00 mg/dL Final  . Calcium 11/29/2014 8.4* 8.9 - 10.3 mg/dL Final  . GFR calc non Af Amer 11/29/2014 >60  >60 mL/min Final  . GFR calc Af Amer 11/29/2014 >60  >60 mL/min Final   Comment: (NOTE) The eGFR has been calculated using the CKD EPI equation. This calculation has not been validated in all clinical situations. eGFR's persistently <60 mL/min signify possible Chronic Kidney Disease.   . Anion gap 11/29/2014 14  5 - 15 Final  . Alcohol, Ethyl (B) 11/29/2014 150* <5 mg/dL Final   Comment:        LOWEST DETECTABLE LIMIT FOR SERUM ALCOHOL IS 5 mg/dL FOR MEDICAL PURPOSES ONLY   . WBC 11/29/2014 7.2  3.6 - 11.0 K/uL Final  . RBC 11/29/2014 3.52* 3.80 - 5.20 MIL/uL Final  . Hemoglobin 11/29/2014 12.6  12.0 - 16.0 g/dL Final  . HCT 11/29/2014 35.7  35.0 - 47.0 % Final  . MCV 11/29/2014 101.6* 80.0 - 100.0 fL Final  . MCH 11/29/2014 35.8* 26.0 - 34.0 pg Final  . MCHC 11/29/2014 35.2  32.0 - 36.0 g/dL Final    . RDW 11/29/2014 12.6  11.5 - 14.5 % Final  . Platelets 11/29/2014 434  150 - 440 K/uL Final  . Troponin I 11/29/2014 <0.03  <0.031 ng/mL Final   Comment:        NO INDICATION OF MYOCARDIAL INJURY.   . Prothrombin Time 11/29/2014 13.1  11.4 - 15.0 seconds Final  . INR 11/29/2014 0.97   Final  . Specimen Description 11/29/2014 URINE, RANDOM   Final  . Special Requests 11/29/2014 NONE   Final  . Culture 11/29/2014 NO GROWTH 2 DAYS   Final  . Report Status 11/29/2014 12/01/2014 FINAL   Final  . aPTT 11/29/2014 35  24 - 36 seconds Final  . Albumin 11/29/2014 3.8  3.5 - 5.0 g/dL Final  . Vit D, 25-Hydroxy 11/29/2014 55.9  30.0 - 100.0 ng/mL Final   Comment: (NOTE) Vitamin D deficiency has been defined by the [place] practice guideline as a level of serum 25-OH vitamin D less than 20 ng/mL (1,2). The Endocrine Society went on to further define vitamin D insufficiency as a level between 21 and 29 ng/mL (2). 1. IOM (Institute of Medicine). 2010. Dietary reference   intakes for calcium and D. [place]: The   [employer]. 2. Holick MF, Binkley [place], Bischoff-Ferrari HA, et al.   Evaluation, treatment, and prevention of vitamin D   deficiency: an Endocrine Society clinical practice   guideline. JCEM. 2011 Jul; 96(7):1911-30. Performed At: [facility] [place], [place] [id number] Lindon Romp MD DG:[id number]   . WBC 11/30/2014 5.5  3.6 - 11.0 K/uL Final  . RBC 11/30/2014 3.19* 3.80 - 5.20 MIL/uL Final  . Hemoglobin 11/30/2014 11.9* 12.0 - 16.0 g/dL Final  . HCT 11/30/2014 32.8* 35.0 - 47.0 % Final  . MCV 11/30/2014 102.8* 80.0 - 100.0 fL Final  . MCH 11/30/2014 37.3* 26.0 - 34.0 pg Final  . MCHC 11/30/2014 36.3* 32.0 - 36.0 g/dL Final  . RDW 11/30/2014 12.8  11.5 - 14.5 % Final  .  Platelets 11/30/2014 365  150 - 440 K/uL Final  . Sodium 11/30/2014 124* 135 - 145 mmol/L Final  . Potassium 11/30/2014 3.5   3.5 - 5.1 mmol/L Final  . Chloride 11/30/2014 87* 101 - 111 mmol/L Final  . CO2 11/30/2014 28  22 - 32 mmol/L Final  . Glucose, Bld 11/30/2014 68  65 - 99 mg/dL Final  . BUN 11/30/2014 <5* 6 - 20 mg/dL Final  . Creatinine, Ser 11/30/2014 0.41* 0.44 - 1.00 mg/dL Final  . Calcium 11/30/2014 8.1* 8.9 - 10.3 mg/dL Final  . GFR calc non Af Amer 11/30/2014 >60  >60 mL/min Final  . GFR calc Af Amer 11/30/2014 >60  >60 mL/min Final   Comment: (NOTE) The eGFR has been calculated using the CKD EPI equation. This calculation has not been validated in all clinical situations. eGFR's persistently <60 mL/min signify possible Chronic Kidney Disease.   . Anion gap 11/30/2014 9  5 - 15 Final  . ABO/RH(D) 11/29/2014 O POS   Final  . Antibody Screen 11/29/2014 NEG   Final  . Sample Expiration 11/29/2014 12/02/2014   Final  . ABO/RH(D) 11/29/2014 O POS   Final  . Osmolality, Ur 11/29/2014 182* 300 - 900 mOsm/kg Final  . Osmolality 11/29/2014 261* 275 - 300 mOsm/kg Final  . Sodium, Ur 11/29/2014 44   Final  . MRSA, PCR 11/30/2014 NEGATIVE  NEGATIVE Final  . Staphylococcus aureus 11/30/2014 NEGATIVE  NEGATIVE Final   Comment:        The Xpert SA Assay (FDA approved for NASAL specimens in patients over 27 years of age), is one component of a comprehensive surveillance program.  Test performance has been validated by [facility] for patients greater than or equal to 54 year old. It is not intended to diagnose infection nor to guide or monitor treatment.   . Sodium 11/30/2014 125* 135 - 145 mmol/L Final  . Potassium 11/30/2014 4.1  3.5 - 5.1 mmol/L Final  . Chloride 11/30/2014 87* 101 - 111 mmol/L Final  . CO2 11/30/2014 23  22 - 32 mmol/L Final  . Glucose, Bld 11/30/2014 110* 65 - 99 mg/dL Final  . BUN 11/30/2014 8  6 - 20 mg/dL Final  . Creatinine, Ser 11/30/2014 0.60  0.44 - 1.00 mg/dL Final  . Calcium 11/30/2014 8.6* 8.9 - 10.3 mg/dL Final  . GFR calc non Af Amer 11/30/2014 >60  >60 mL/min  Final  . GFR calc Af Amer 11/30/2014 >60  >60 mL/min Final   Comment: (NOTE) The eGFR has been calculated using the CKD EPI equation. This calculation has not been validated in all clinical situations. eGFR's persistently <60 mL/min signify possible Chronic Kidney Disease.   . Anion gap 11/30/2014 15  5 - 15 Final  . Sodium 12/01/2014 128* 135 - 145 mmol/L Final  . Potassium 12/01/2014 3.5  3.5 - 5.1 mmol/L Final  . Chloride 12/01/2014 92* 101 - 111 mmol/L Final  . CO2 12/01/2014 28  22 - 32 mmol/L Final  . Glucose, Bld 12/01/2014 163* 65 - 99 mg/dL Final  . BUN 12/01/2014 16  6 - 20 mg/dL Final  . Creatinine, Ser 12/01/2014 0.55  0.44 - 1.00 mg/dL Final  . Calcium 12/01/2014 8.8* 8.9 - 10.3 mg/dL Final  . GFR calc non Af Amer 12/01/2014 >60  >60 mL/min Final  . GFR calc Af Amer 12/01/2014 >60  >60 mL/min Final   Comment: (NOTE) The eGFR has been calculated using the CKD EPI equation. This calculation  has not been validated in all clinical situations. eGFR's persistently <60 mL/min signify possible Chronic Kidney Disease.   . Anion gap 12/01/2014 8  5 - 15 Final  . WBC 12/01/2014 8.5  3.6 - 11.0 K/uL Final  . RBC 12/01/2014 2.94* 3.80 - 5.20 MIL/uL Final  . Hemoglobin 12/01/2014 10.8* 12.0 - 16.0 g/dL Final  . HCT 12/01/2014 30.6* 35.0 - 47.0 % Final  . MCV 12/01/2014 104.0* 80.0 - 100.0 fL Final  . MCH 12/01/2014 36.8* 26.0 - 34.0 pg Final  . MCHC 12/01/2014 35.4  32.0 - 36.0 g/dL Final  . RDW 12/01/2014 12.6  11.5 - 14.5 % Final  . Platelets 12/01/2014 367  150 - 440 K/uL Final  . Sodium 12/01/2014 129* 135 - 145 mmol/L Final  . Sodium 12/01/2014 132* 135 - 145 mmol/L Final  . Potassium 12/01/2014 3.5  3.5 - 5.1 mmol/L Final  . Chloride 12/01/2014 94* 101 - 111 mmol/L Final  . CO2 12/01/2014 28  22 - 32 mmol/L Final  . Glucose, Bld 12/01/2014 123* 65 - 99 mg/dL Final  . BUN 12/01/2014 7  6 - 20 mg/dL Final  . Creatinine, Ser 12/01/2014 0.49  0.44 - 1.00 mg/dL Final  .  Calcium 12/01/2014 8.3* 8.9 - 10.3 mg/dL Final  . GFR calc non Af Amer 12/01/2014 >60  >60 mL/min Final  . GFR calc Af Amer 12/01/2014 >60  >60 mL/min Final   Comment: (NOTE) The eGFR has been calculated using the CKD EPI equation. This calculation has not been validated in all clinical situations. eGFR's persistently <60 mL/min signify possible Chronic Kidney Disease.   . Anion gap 12/01/2014 10  5 - 15 Final  . WBC 12/02/2014 8.3  3.6 - 11.0 K/uL Final  . RBC 12/02/2014 2.36* 3.80 - 5.20 MIL/uL Final  . Hemoglobin 12/02/2014 9.0* 12.0 - 16.0 g/dL Final  . HCT 12/02/2014 24.8* 35.0 - 47.0 % Final  . MCV 12/02/2014 104.7* 80.0 - 100.0 fL Final  . MCH 12/02/2014 38.2* 26.0 - 34.0 pg Final  . MCHC 12/02/2014 36.5* 32.0 - 36.0 g/dL Final  . RDW 12/02/2014 12.7  11.5 - 14.5 % Final  . Platelets 12/02/2014 329  150 - 440 K/uL Final  . Sodium 12/02/2014 133* 135 - 145 mmol/L Final  . Potassium 12/02/2014 2.8* 3.5 - 5.1 mmol/L Final   Comment: RESULTS VERIFIED BY REPEAT TESTING CRITICAL RESULT CALLED TO, READ BACK BY AND VERIFIED WITH IVIE FRYAR AT 7341 12/02/14.PMH   . Chloride 12/02/2014 100* 101 - 111 mmol/L Final  . CO2 12/02/2014 25  22 - 32 mmol/L Final  . Glucose, Bld 12/02/2014 112* 65 - 99 mg/dL Final  . BUN 12/02/2014 <5* 6 - 20 mg/dL Final  . Creatinine, Ser 12/02/2014 0.52  0.44 - 1.00 mg/dL Final  . Calcium 12/02/2014 7.0* 8.9 - 10.3 mg/dL Final  . GFR calc non Af Amer 12/02/2014 >60  >60 mL/min Final  . GFR calc Af Amer 12/02/2014 >60  >60 mL/min Final   Comment: (NOTE) The eGFR has been calculated using the CKD EPI equation. This calculation has not been validated in all clinical situations. eGFR's persistently <60 mL/min signify possible Chronic Kidney Disease.   . Anion gap 12/02/2014 8  5 - 15 Final  . TSH 12/02/2014 1.476  0.350 - 4.500 uIU/mL Final  . Cortisol, Plasma 12/02/2014 10.1   Final   Comment: (NOTE) AM    6.7 - 22.6 ug/dL PM   <10.0  ug/dL Performed at [facility]   . Sodium 12/03/2014 131* 135 - 145 mmol/L Final  . Potassium 12/03/2014 4.1  3.5 - 5.1 mmol/L Final  . Chloride 12/03/2014 98* 101 - 111 mmol/L Final  . CO2 12/03/2014 26  22 - 32 mmol/L Final  . Glucose, Bld 12/03/2014 98  65 - 99 mg/dL Final  . BUN 12/03/2014 5* 6 - 20 mg/dL Final  . Creatinine, Ser 12/03/2014 0.46  0.44 - 1.00 mg/dL Final  . Calcium 12/03/2014 8.0* 8.9 - 10.3 mg/dL Final  . GFR calc non Af Amer 12/03/2014 >60  >60 mL/min Final  . GFR calc Af Amer 12/03/2014 >60  >60 mL/min Final   Comment: (NOTE) The eGFR has been calculated using the CKD EPI equation. This calculation has not been validated in all clinical situations. eGFR's persistently <60 mL/min signify possible Chronic Kidney Disease.   . Anion gap 12/03/2014 7  5 - 15 Final  . Magnesium 12/03/2014 1.4* 1.7 - 2.4 mg/dL Final  . WBC 12/03/2014 6.3  3.6 - 11.0 K/uL Final  . RBC 12/03/2014 2.58* 3.80 - 5.20 MIL/uL Final  . Hemoglobin 12/03/2014 9.3* 12.0 - 16.0 g/dL Final  . HCT 12/03/2014 27.1* 35.0 - 47.0 % Final  . MCV 12/03/2014 104.8* 80.0 - 100.0 fL Final  . MCH 12/03/2014 36.0* 26.0 - 34.0 pg Final  . MCHC 12/03/2014 34.4  32.0 - 36.0 g/dL Final  . RDW 12/03/2014 12.6  11.5 - 14.5 % Final  . Platelets 12/03/2014 380  150 - 440 K/uL Final     STUDIES: IMPRESSION: 1. There is intense FDG uptake associated with the right lower lobe central mass which is worrisome for primary bronchogenic carcinoma. 2. No evidence for hypermetabolic mediastinal or hilar adenopathy and no evidence for distant metastatic disease. 3. Emphysema 4. Aortic atherosclerosis. Dg Chest 1 View  12/01/2014   CLINICAL DATA:  Shortness of breath history of asthma and COPD, patient smokes  EXAM: CHEST  1 VIEW  COMPARISON:  11/29/2014  FINDINGS: Heart size and vascular pattern normal. Hyperinflation suggests COPD. No consolidation effusion or pneumothorax. Known infrahilar right lung mass  stable.  IMPRESSION: COPD.  Known right lung mass stable.   Electronically Signed   By: Skipper Cliche M.D.   On: 12/01/2014 08:01   Dg Chest 1 View  11/29/2014   CLINICAL DATA:  Pt fell from standing today onto kitchen floor. Injuring left side. pts c/o left hip pain, left leg is externally rotated. No previous surgeries.  EXAM: CHEST  1 VIEW  COMPARISON:  Chest CT, 11/04/2014  FINDINGS: Right infrahilar mass is noted as better defined on the recent CT. There is no lung consolidation or edema. No pleural effusion or pneumothorax.  Cardiac silhouette is normal in size. No mediastinal masses or convincing adenopathy. Left hilum is unremarkable. New  Bones are demineralized. Compression fractures are noted of the thoracic spine as were present on the prior CT.  IMPRESSION: 1. No acute cardiopulmonary disease. 2. Right lower lobe mass. Please refer to the recent prior CT scan of the chest report. 3. Multiple thoracic compression fractures.   Electronically Signed   By: Lajean Manes M.D.   On: 11/29/2014 18:26   Ct Hip Left Wo Contrast  11/29/2014   EXAM: CT OF THE LEFT HIP WITHOUT CONTRAST  TECHNIQUE: Multidetector CT imaging of the left hip was performed according to the standard protocol. Multiplanar CT image reconstructions were also generated.  COMPARISON:  Current left hip radiograph  showing a left proximal femur fracture.  FINDINGS: As noted on standard radiographs, there is a displaced comminuted intertrochanteric fracture of the proximal femur. Primary fracture line extends from the medial base of the femoral neck through the greater trochanter. There are additional fracture fragments from the lesser and greater trochanters. Major fracture fragments displaced, with the base of the femoral neck displacing lateral to the shaft component by 15 mm. Fracture displacement is also anterior measuring approximate 12 mm. This leads to mild apex anterior angulation. There is also mild varus angulation. The major  fracture components are impacted, again by approximate 1.5 cm.  There are no other fractures. The left hip joint is normally aligned.  There is a left hip joint effusion consistent with hemarthrosis, small to moderate in size.  Surrounding muscular structures are unremarkable.  Within the pelvis, Foley catheter partly decompresses the bladder. There are vascular calcifications. No masses or adenopathy. No ascites or abnormal fluid collections.  IMPRESSION: 1. Mildly displaced, impacted and comminuted intratrochanteric fracture of left proximal femur as detailed above.   Electronically Signed   By: Lajean Manes M.D.   On: 11/29/2014 19:50   Nm Pet Image Initial (pi) Skull Base To Thigh  11/14/2014   CLINICAL DATA:  Initial treatment strategy for lung mass.  EXAM: NUCLEAR MEDICINE PET SKULL BASE TO THIGH  TECHNIQUE: 12.36 mCi F-18 FDG was injected intravenously. Full-ring PET imaging was performed from the skull base to thigh after the radiotracer. CT data was obtained and used for attenuation correction and anatomic localization.  FASTING BLOOD GLUCOSE:  Value: 81 mg/dl  COMPARISON:  11/04/2014  FINDINGS: NECK  No hypermetabolic lymph nodes in the neck.  CHEST  No hypermetabolic mediastinal or hilar nodes. Central, right lower lobe mass is identified measuring 3.1 cm, image 92 of series 3. The SUV max associated with this mass is equal to 11.15. No hypermetabolic hilar or mediastinal adenopathy. Mild changes of centrilobular emphysema identified. Aortic atherosclerosis noted.  ABDOMEN/PELVIS  No abnormal hypermetabolic activity within the liver, pancreas, adrenal glands, or spleen. No hypermetabolic lymph nodes in the abdomen or pelvis.  SKELETON  No focal hypermetabolic activity to suggest skeletal metastasis. Previous hardware fixation of the proximal right femur. Again noted are compression deformities involving the thoracic and lumbar spine. No hypermetabolic bone lesions identified.  IMPRESSION: 1. There is  intense FDG uptake associated with the right lower lobe central mass which is worrisome for primary bronchogenic carcinoma. 2. No evidence for hypermetabolic mediastinal or hilar adenopathy and no evidence for distant metastatic disease. 3. Emphysema 4. Aortic atherosclerosis.   Electronically Signed   By: Kerby Moors M.D.   On: 11/14/2014 10:46   Chest Portable 1 View  11/29/2014   CLINICAL DATA:  Hip fracture.  Preop study.  EXAM: PORTABLE CHEST - 1 VIEW  COMPARISON:  11/29/2014 at 1803 hours  FINDINGS: No change from the earlier exam. There is an infrahilar mass is noted previously, better evaluated on the chest CT dated 11/04/2014.  There is no lung consolidation or edema. No pleural effusion or pneumothorax.  Cardiac silhouette is normal in size. No mediastinal mass or convincing adenopathy.  Bony thorax is demineralized. There are old healed rib fractures on right.  IMPRESSION: No acute cardiopulmonary disease.  Right lower lobe, infrahilar mass.   Electronically Signed   By: Lajean Manes M.D.   On: 11/29/2014 19:44   Dg Hip Unilat With Pelvis 1v Left  12/01/2014   CLINICAL DATA:  Post LEFT hip  operative repair today  EXAM: DG HIP (WITH OR WITHOUT PELVIS) 1V*L*  COMPARISON:  11/29/2014  FINDINGS: Interval placement of IM nail with proximal compression screw at the LEFT femur post ORIF of a reduced intertrochanteric LEFT femoral fracture.  No dislocation.  Lateral soft tissue gas throughout the thigh.  Distal locking screw present at the IM nail.  IM nail with compression screw at proximal RIGHT femur unchanged.  Symmetric hip and SI joints.  Old healed inferior LEFT pubic ramus fracture.  Scattered atherosclerotic calcification and pelvic the lips.  IMPRESSION: Post ORIF of a reduced intertrochanteric LEFT femoral fracture.   Electronically Signed   By: Lavonia Dana M.D.   On: 12/01/2014 18:25   Dg Hip Operative Unilat With Pelvis Left  12/01/2014   CLINICAL DATA:  LEFT hip surgery  EXAM: OPERATIVE  LEFT HIP (WITH PELVIS IF PERFORMED)  VIEWS  TECHNIQUE: Fluoroscopic spot image(s) were submitted for interpretation post-operatively.  FLUOROSCOPY TIME:  0 minutes 49 seconds  Number of images: 4  COMPARISON:  CT LEFT hip 11/29/2014  FINDINGS: Osseous demineralization.  Placement of a long IM nail with proximal compression screw at the proximal LEFT femur post ORIF of a reduced intertrochanteric fracture.  No additional fracture or dislocation.  Distal locking screw present at IM nail.  IMPRESSION: ORIF of an intertrochanteric fracture of LEFT femur.   Electronically Signed   By: Lavonia Dana M.D.   On: 12/01/2014 17:43   Dg Hip Unilat With Pelvis 2-3 Views Left  11/29/2014   CLINICAL DATA:  Hip pain after falling today.  Initial encounter.  EXAM: DG HIP (WITH OR WITHOUT PELVIS) 2-3V LEFT  COMPARISON:  PET-CT 11/14/2014.  FINDINGS: The bones are diffusely demineralized. There is an acute comminuted and mildly angulated intertrochanteric femur fracture on the left. Old fracture of the left inferior pubic ramus appears unchanged from prior CT. Patient is status post right femoral neck dynamic screw and intramedullary rod fixation. No evidence of dislocation. Mild sacroiliac and lower lumbar spine degenerative changes are present. There are diffuse vascular calcifications.  IMPRESSION: Acute, comminuted and angulated intertrochanteric left femur fracture.   Electronically Signed   By: Richardean Sale M.D.   On: 11/29/2014 18:25    ASSESSMENT: . Right lower lobe lung mass PET scan and CT scan has been reviewed independently and reviewed with the patient 2, COPD 3.  Multiple vertebral compression fracture.  Osteoporosis patient has been started on Fosamax 4.  Right hip fracture status post internal fixation on November 29, 2014 Patient is on Lovenox undergoing rehabilitation therapy at home Making progress in terms of ambulation 5.  Diabetes 6.  Hyponatremia has been noticed at the time of admission in the  hospital in August 5 which has been later on corrected PLAN:   I discussed the situation with patient.  Will get CT scan and PET scan evaluated by pulmonologist to see whether any bronchoscopy or EUS would be useful.  Patient has to come off Lovenox prior to biopsy.  Patient and family desires to wait until the patient starts ambulating somewhat better and come off Lovenox and recover from the hip surgery. I also discussed situation with thoracic oncology surgeon Dr. Faith Rogue. Our lung cancer navigator Mr. Dara Lords will let me know about pulmonary evaluation   Patient expressed understanding and was in agreement with this plan. She also understands that She can call clinic at any time with any questions, concerns, or complaints.    No matching staging information was found  for the patient.  Forest Gleason, MD   12/11/2014 8:10 AM

## 2014-12-17 ENCOUNTER — Telehealth: Payer: Self-pay | Admitting: *Deleted

## 2014-12-17 NOTE — Telephone Encounter (Signed)
  Oncology Nurse Navigator Documentation    Navigator Encounter Type: Telephone (12/17/14 0800)               Spoke to Lavinia, sister, who will be with patient at orthopedic followup today. She will discuss need for biopsy and when patient can stop lovenox. Reports patient is more mobile and doing well. I will follow up with this as well.

## 2014-12-20 ENCOUNTER — Telehealth: Payer: Self-pay | Admitting: *Deleted

## 2014-12-20 NOTE — Telephone Encounter (Signed)
  Oncology Nurse Navigator Documentation    Navigator Encounter Type: Telephone (12/20/14 1500)         Interventions: Coordination of Care (12/20/14 1500)            Time Spent with Patient: 15 (12/20/14 1500)   Notified sister, Mariann Laster of pre-op appointment scheduled for Monday 12/23/14 by phone. Also notified of procedure scheduled for Thursday 12/26/14 [place] arrival. Reminded to be NPO after midnight, continue to avoid anticoagulant medications,(last dose of lovenox on Monday), and have a driver and support person to be with patient at home after the procedure. Sister verbalizes understanding and reads back appointments.

## 2014-12-23 ENCOUNTER — Inpatient Hospital Stay: Admission: RE | Admit: 2014-12-23 | Payer: Medicare Other | Source: Ambulatory Visit

## 2014-12-23 NOTE — Patient Instructions (Signed)
  Your procedure is scheduled [id number] Sept 1, 2016. Report to Same Day Surgery. To find out your arrival time please call [phone] between 1PM - 3PM on Wednesday December 25, 2014.  Remember: Instructions that are not followed completely may result in serious medical risk, up to and including death, or upon the discretion of your surgeon and anesthesiologist your surgery may need to be rescheduled.    __x__ 1. Do not eat food or drink liquids after midnight. No gum chewing or hard candies.     __x__ 2. No Alcohol for 24 hours before or after surgery.   ____ 3. Bring all medications with you on the day of surgery if instructed.    __x__ 4. Notify your doctor if there is any change in your medical condition     (cold, fever, infections).     Do not wear jewelry, make-up, hairpins, clips or nail polish.  Do not wear lotions, powders, or perfumes. You may wear deodorant.  Do not shave 48 hours prior to surgery. Men may shave face and neck.  Do not bring valuables to the hospital.    [facility] is not responsible for any belongings or valuables.               Contacts, dentures or bridgework may not be worn into surgery.  Leave your suitcase in the car. After surgery it may be brought to your room.  For patients admitted to the hospital, discharge time is determined by your treatment team.   Patients discharged the day of surgery will not be allowed to drive home.    Please read over the following fact sheets that you were given:   [facility] Preparing for Surgery  __x__ Take these medicines the morning of surgery with A SIP OF WATER:    1. diltiazem (CARDIZEM)    ____ Fleet Enema (as directed)   ____ Use CHG Soap as directed  _x___ Use inhalers on the day of surgery  ____ Stop metformin 2 days prior to surgery    ____ Take 1/2 of usual insulin dose the night before surgery and none on the morning of surgery.   ____ Stop Coumadin/Plavix/aspirin on does not  apply.  ____ Stop Anti-inflammatories on does not apply.   ____ Stop supplements until after surgery.    ____ Bring C-Pap to the hospital.

## 2014-12-24 ENCOUNTER — Telehealth: Payer: Self-pay | Admitting: *Deleted

## 2014-12-24 ENCOUNTER — Ambulatory Visit
Admission: RE | Admit: 2014-12-24 | Discharge: 2014-12-24 | Disposition: A | Discharge status: Home / Self Care | Payer: Medicare Other | Source: Ambulatory Visit | Attending: Internal Medicine | Admitting: Internal Medicine

## 2014-12-24 DIAGNOSIS — Z01812 Encounter for preprocedural laboratory examination: Secondary | ICD-10-CM | POA: Insufficient documentation

## 2014-12-24 DIAGNOSIS — J984 Other disorders of lung: Secondary | ICD-10-CM | POA: Diagnosis present

## 2014-12-24 LAB — CBC
HCT: 31.1 % — ABNORMAL LOW (ref 35.0–47.0)
Hemoglobin: 10.5 g/dL — ABNORMAL LOW (ref 12.0–16.0)
MCH: 34.6 pg — AB (ref 26.0–34.0)
MCHC: 33.8 g/dL (ref 32.0–36.0)
MCV: 102.4 fL — ABNORMAL HIGH (ref 80.0–100.0)
PLATELETS: 443 10*3/uL — AB (ref 150–440)
RBC: 3.04 MIL/uL — ABNORMAL LOW (ref 3.80–5.20)
RDW: 12.2 % (ref 11.5–14.5)
WBC: 4.2 10*3/uL (ref 3.6–11.0)

## 2014-12-24 NOTE — Telephone Encounter (Signed)
Notified sister, Mariann Laster that patient needs to arrive at 1pm on Thursday prior to procedure scheduled at 3pm for repeat CT scan due to inability to obtain thin slice images from current scan. Verbalizes understanding.

## 2014-12-26 ENCOUNTER — Ambulatory Visit
Admission: RE | Admit: 2014-12-26 | Discharge: 2014-12-26 | Disposition: A | Discharge status: Home / Self Care | Payer: Medicare Other | Source: Ambulatory Visit | Attending: Internal Medicine | Admitting: Internal Medicine

## 2014-12-26 ENCOUNTER — Encounter: Payer: Self-pay | Admitting: *Deleted

## 2014-12-26 ENCOUNTER — Ambulatory Visit: Payer: Medicare Other | Admitting: Anesthesiology

## 2014-12-26 ENCOUNTER — Encounter
Admission: RE | Disposition: A | Discharge status: Home / Self Care | Payer: Self-pay | Source: Ambulatory Visit | Attending: Internal Medicine

## 2014-12-26 DIAGNOSIS — I10 Essential (primary) hypertension: Secondary | ICD-10-CM | POA: Insufficient documentation

## 2014-12-26 DIAGNOSIS — S72002S Fracture of unspecified part of neck of left femur, sequela: Secondary | ICD-10-CM | POA: Insufficient documentation

## 2014-12-26 DIAGNOSIS — M810 Age-related osteoporosis without current pathological fracture: Secondary | ICD-10-CM | POA: Diagnosis not present

## 2014-12-26 DIAGNOSIS — R918 Other nonspecific abnormal finding of lung field: Secondary | ICD-10-CM | POA: Diagnosis present

## 2014-12-26 DIAGNOSIS — Z7951 Long term (current) use of inhaled steroids: Secondary | ICD-10-CM | POA: Insufficient documentation

## 2014-12-26 DIAGNOSIS — R079 Chest pain, unspecified: Secondary | ICD-10-CM | POA: Diagnosis not present

## 2014-12-26 DIAGNOSIS — W19XXXS Unspecified fall, sequela: Secondary | ICD-10-CM | POA: Insufficient documentation

## 2014-12-26 DIAGNOSIS — Z880 Allergy status to penicillin: Secondary | ICD-10-CM | POA: Diagnosis not present

## 2014-12-26 DIAGNOSIS — M4850XS Collapsed vertebra, not elsewhere classified, site unspecified, sequela of fracture: Secondary | ICD-10-CM | POA: Diagnosis not present

## 2014-12-26 DIAGNOSIS — Z7982 Long term (current) use of aspirin: Secondary | ICD-10-CM | POA: Insufficient documentation

## 2014-12-26 DIAGNOSIS — F329 Major depressive disorder, single episode, unspecified: Secondary | ICD-10-CM | POA: Insufficient documentation

## 2014-12-26 DIAGNOSIS — Z8379 Family history of other diseases of the digestive system: Secondary | ICD-10-CM | POA: Diagnosis not present

## 2014-12-26 DIAGNOSIS — F419 Anxiety disorder, unspecified: Secondary | ICD-10-CM | POA: Diagnosis not present

## 2014-12-26 DIAGNOSIS — Z8249 Family history of ischemic heart disease and other diseases of the circulatory system: Secondary | ICD-10-CM | POA: Diagnosis not present

## 2014-12-26 DIAGNOSIS — E119 Type 2 diabetes mellitus without complications: Secondary | ICD-10-CM | POA: Diagnosis not present

## 2014-12-26 DIAGNOSIS — F17200 Nicotine dependence, unspecified, uncomplicated: Secondary | ICD-10-CM | POA: Insufficient documentation

## 2014-12-26 DIAGNOSIS — J449 Chronic obstructive pulmonary disease, unspecified: Secondary | ICD-10-CM | POA: Insufficient documentation

## 2014-12-26 DIAGNOSIS — Z79899 Other long term (current) drug therapy: Secondary | ICD-10-CM | POA: Diagnosis not present

## 2014-12-26 DIAGNOSIS — R05 Cough: Secondary | ICD-10-CM | POA: Diagnosis not present

## 2014-12-26 DIAGNOSIS — C3431 Malignant neoplasm of lower lobe, right bronchus or lung: Secondary | ICD-10-CM | POA: Insufficient documentation

## 2014-12-26 DIAGNOSIS — J984 Other disorders of lung: Secondary | ICD-10-CM

## 2014-12-26 HISTORY — PX: ELECTROMAGNETIC NAVIGATION BROCHOSCOPY: SHX5369

## 2014-12-26 SURGERY — ELECTROMAGNETIC NAVIGATION BRONCHOSCOPY
Anesthesia: General

## 2014-12-26 MED ORDER — PROPOFOL 10 MG/ML IV BOLUS
INTRAVENOUS | Status: DC | PRN
  Administered 2014-12-26: 90 mg via INTRAVENOUS
  Administered 2014-12-26: 30 mg via INTRAVENOUS

## 2014-12-26 MED ORDER — FENTANYL CITRATE (PF) 100 MCG/2ML IJ SOLN
INTRAMUSCULAR | Status: DC | PRN
  Administered 2014-12-26: 50 ug via INTRAVENOUS

## 2014-12-26 MED ORDER — FAMOTIDINE 20 MG PO TABS
20.0000 mg | ORAL_TABLET | Freq: Once | ORAL | Status: AC
  Administered 2014-12-26: 20 mg via ORAL

## 2014-12-26 MED ORDER — ONDANSETRON HCL 4 MG/2ML IJ SOLN: 4.0000 mg | Freq: Once | INTRAMUSCULAR | Status: DC | PRN

## 2014-12-26 MED ORDER — PHENYLEPHRINE HCL 0.25 % NA SOLN: 1.0000 | Freq: Four times a day (QID) | NASAL | Status: DC | PRN

## 2014-12-26 MED ORDER — SUCCINYLCHOLINE CHLORIDE 20 MG/ML IJ SOLN
INTRAMUSCULAR | Status: DC | PRN
  Administered 2014-12-26: 80 mg via INTRAVENOUS

## 2014-12-26 MED ORDER — ALBUTEROL SULFATE HFA 108 (90 BASE) MCG/ACT IN AERS
INHALATION_SPRAY | RESPIRATORY_TRACT | Status: DC | PRN
  Administered 2014-12-26: 6 via RESPIRATORY_TRACT

## 2014-12-26 MED ORDER — LIDOCAINE HCL 2 % EX GEL: 1.0000 "application " | Freq: Once | CUTANEOUS | Status: DC

## 2014-12-26 MED ORDER — LACTATED RINGERS IV SOLN
INTRAVENOUS | Status: DC
  Administered 2014-12-26: 13:00:00 via INTRAVENOUS

## 2014-12-26 MED ORDER — LIDOCAINE HCL (CARDIAC) 20 MG/ML IV SOLN
INTRAVENOUS | Status: DC | PRN
  Administered 2014-12-26: 30 mg via INTRAVENOUS

## 2014-12-26 MED ORDER — PHENYLEPHRINE HCL 0.25 % NA SOLN
1.0000 | Freq: Four times a day (QID) | NASAL | Status: DC | PRN
  Filled 2014-12-26: qty 15

## 2014-12-26 MED ORDER — HYDRALAZINE HCL 20 MG/ML IJ SOLN
INTRAMUSCULAR | Status: AC
  Administered 2014-12-26: 5 mg via INTRAVENOUS
  Filled 2014-12-26: qty 1

## 2014-12-26 MED ORDER — MIDAZOLAM HCL 5 MG/5ML IJ SOLN
INTRAMUSCULAR | Status: DC | PRN
  Administered 2014-12-26: 1 mg via INTRAVENOUS

## 2014-12-26 MED ORDER — FENTANYL CITRATE (PF) 100 MCG/2ML IJ SOLN: 25.0000 ug | INTRAMUSCULAR | Status: DC | PRN

## 2014-12-26 MED ORDER — FAMOTIDINE 20 MG PO TABS
ORAL_TABLET | ORAL | Status: AC
  Filled 2014-12-26: qty 1

## 2014-12-26 MED ORDER — HYDRALAZINE HCL 20 MG/ML IJ SOLN
5.0000 mg | INTRAMUSCULAR | Status: DC | PRN
  Administered 2014-12-26: 5 mg via INTRAVENOUS

## 2014-12-26 NOTE — Discharge Instructions (Signed)
Flexible Bronchoscopy, Care After Refer to this sheet in the next few weeks. These instructions provide you with information on caring for yourself after your procedure. Your health care provider may also give you more specific instructions. Your treatment has been planned according to current medical practices, but problems sometimes occur. Call your health care provider if you have any problems or questions after your procedure.  WHAT TO EXPECT AFTER THE PROCEDURE It is normal to have the following symptoms for 24-48 hours after the procedure:   Increased cough.  Low-grade fever.  Sore throat or hoarse voice.  Small streaks of blood in your thick spit (sputum) if tissue samples were taken (biopsy). HOME CARE INSTRUCTIONS   Do not eat or drink anything for 2 hours after your procedure. Your nose and throat were numbed by medicine. If you try to eat or drink before the medicine wears off, food or drink could go into your lungs or you could burn yourself. After the numbness is gone and your cough and gag reflexes have returned, you may eat soft food and drink liquids slowly.   The day after the procedure, you can go back to your normal diet.   You may resume normal activities.   Keep all follow-up visits as directed by your health care provider. It is important to keep all your appointments, especially if tissue samples were taken for testing (biopsy). SEEK IMMEDIATE MEDICAL CARE IF:   You have increasing shortness of breath.   You become light-headed or faint.   You have chest pain.   You have any new concerning symptoms.  You cough up more than a small amount of blood.  The amount of blood you cough up increases. MAKE SURE YOU:  Understand these instructions.  Will watch your condition.  Will get help right away if you are not doing well or get worse. Document Released: 10/30/2004 Document Revised: 08/27/2013 Document Reviewed: 12/15/2012 [facility] Patient Information  2015 [place], [place]. This information is not intended to replace advice given to you by your health care provider. Make sure you discuss any questions you have with your health care provider.     AMBULATORY SURGERY  DISCHARGE INSTRUCTIONS   1) The drugs that you were given will stay in your system until tomorrow so for the next 24 hours you should not:  A) Drive an automobile B) Make any legal decisions C) Drink any alcoholic beverage   2) You may resume regular meals tomorrow.  Today it is better to start with liquids and gradually work up to solid foods.  You may eat anything you prefer, but it is better to start with liquids, then soup and crackers, and gradually work up to solid foods.   3) Please notify your doctor immediately if you have any unusual bleeding, trouble breathing, redness and pain at the surgery site, drainage, fever, or pain not relieved by medication. 4)   5) Your post-operative visit with Dr.                                     is: Date:                        Time:    Please call to schedule your post-operative visit.  6) Additional Instructions: 7)

## 2014-12-26 NOTE — Anesthesia Procedure Notes (Signed)
Procedure Name: Intubation Date/Time: 12/26/2014 3:15 PM Performed by: Dionne Bucy Pre-anesthesia Checklist: Patient identified, Patient being monitored, Timeout performed, Emergency Drugs available and Suction available Patient Re-evaluated:Patient Re-evaluated prior to inductionOxygen Delivery Method: Circle system utilized Preoxygenation: Pre-oxygenation with 100% oxygen Intubation Type: IV induction Ventilation: Mask ventilation without difficulty Laryngoscope Size: Mac and 3 Grade View: Grade II Tube type: Oral Tube size: 8.0 mm Number of attempts: 1 Airway Equipment and Method: Stylet Placement Confirmation: ETT inserted through vocal cords under direct vision,  positive ETCO2 and breath sounds checked- equal and bilateral Secured at: 18 cm Tube secured with: Tape Dental Injury: Teeth and Oropharynx as per pre-operative assessment

## 2014-12-26 NOTE — Anesthesia Postprocedure Evaluation (Signed)
  Anesthesia Post-op Note  Patient: Rachael Palmer  Procedure(s) Performed: Procedure(s): ELECTROMAGNETIC NAVIGATION BRONCHOSCOPY (N/A)  Anesthesia type:General  Patient location: PACU  Post pain: Pain level controlled  Post assessment: Post-op Vital signs reviewed, Patient's Cardiovascular Status Stable, Respiratory Function Stable, Patent Airway and No signs of Nausea or vomiting  Post vital signs: Reviewed and stable  Last Vitals:  Filed Vitals:   12/26/14 1634  BP: 192/93  Pulse: 74  Temp:   Resp: 14    Level of consciousness: awake, alert  and patient cooperative  Complications: No apparent anesthesia complications

## 2014-12-26 NOTE — Transfer of Care (Signed)
Immediate Anesthesia Transfer of Care Note  Patient: Rachael Palmer  Procedure(s) Performed: Procedure(s): ELECTROMAGNETIC NAVIGATION BRONCHOSCOPY (N/A)  Patient Location: PACU  Anesthesia Type:General  Level of Consciousness: awake, alert , oriented and patient cooperative  Airway & Oxygen Therapy: Patient Spontanous Breathing and Patient connected to nasal cannula oxygen  Post-op Assessment: Report given to RN and Post -op Vital signs reviewed and stable  Post vital signs: Reviewed and stable  Last Vitals:  Filed Vitals:   12/26/14 1603  BP: 197/97  Pulse: 89  Temp: 36.7 C  Resp: 17    Complications: No apparent anesthesia complications

## 2014-12-26 NOTE — H&P (View-Only) (Signed)
[facility] [phone]  [phone]   INITIAL CONSULT  Rachael Palmer OB: 08/23/1955  MR#: [id number]  CSN#:[id number]  Patient Care Team: Marshall W Anderson, MD as PCP - General (Internal Medicine)  CHIEF COMPLAINT:  Chief Complaint  Patient presents with  . New Evaluation    VISIT DIAGNOSIS:     ICD-9-CM ICD-10-CM   1. Mass of lower lobe of right lung 786.6 J98.4       No history exists.    Oncology Flowsheet 11/29/2014 11/30/2014 12/02/2014 12/03/2014  LORazepam (ATIVAN) IV 1 mg - - -  ondansetron (ZOFRAN) IV 4 mg - - -  predniSONE (DELTASONE) PO - 20 mg 20 mg 20 mg    INTERVAL HISTORY: 59-year-old lady who had been a chronic smoker and continues to smoke started having cough.  Patient underwent initial evaluation with CT scan of the chest which revealed spiculated right lower lobe lung mass.  Patient underwent PET scan PET scan and right lower lobe lung mass was FDG positive.  No mediastinal adenopathy.  During evaluation patient developed left hip fracture.  Patient fell and broke her left hip underwent surgery on Lovenox undergoing rehabilitation therapy area patient has alsosevere osteoporosis and multiple vertebral compression fracture.  Also has history of COPD and diabetes.  Patient was referred to me for further evaluation regarding right lower lobe lung mass which was PET positive.  She denies any hemoptysis.  Dry hacking cough.  Occasional chest pain. REVIEW OF SYSTEMS:   Gen. status: Patient is in wheelchair because of left hip fracture.  HEENT: Denies any headache or dizziness.  No soreness in the mouth.  Musculoskeletal system multiple vertebral compression fracture MRI scan was done which revealed osteoporotic compression fracture.  Lungs: Patient has COPD.  Not on her neck oxygen at present time.  Chronic smoker.  No hemoptysis.  No chest pain. Cardiac: No paroxysmal nocturnal dyspnea and no coronary artery disease. GI: No nausea no vomiting  or diarrhea appetite remains stable.  No rectal bleeding. Lower extremity no edema. Skin: No rash. Psychiatric system: Anxiety and depression.  As per HPI. Otherwise, a complete review of systems is negatve.  PAST MEDICAL HISTORY: Past Medical History  Diagnosis Date  . Asthma   . Hypertension   . Lung mass   . COPD (chronic obstructive pulmonary disease)   . Alcohol abuse   . Mass of lower lobe of right lung 12/11/2014    PAST SURGICAL HISTORY: Past Surgical History  Procedure Laterality Date  . Hip surgery    . Femur im nail Left 12/01/2014    Procedure: INTRAMEDULLARY (IM) NAIL FEMORAL;  Surgeon: Kevin Krasinski, MD;  Location: [facility] ORS;  Service: Orthopedics;  Laterality: Left;    FAMILY HISTORY Family History  Problem Relation Age of Onset  . Hypertension Mother   . Cirrhosis Father      ADVANCED DIRECTIVES:  Patient does not have any living will or healthcare power of attorney.  Information was given .  Available resources had been discussed.  We will follow-up on subsequent appointments regarding this issue  HEALTH MAINTENANCE: Social History  Substance Use Topics  . Smoking status: Current Every Day Smoker -- 0.25 packs/day for 40 years    Types: Cigarettes  . Smokeless tobacco: None  . Alcohol Use: 12.0 oz/week    20 Cans of beer per week      Allergies  Allergen Reactions  . Penicillins Hives, Itching and Rash    Current Outpatient Prescriptions    Medication Sig Dispense Refill  . albuterol (PROVENTIL HFA;VENTOLIN HFA) 108 (90 BASE) MCG/ACT inhaler Inhale 2 puffs into the lungs every 4 (four) hours as needed for wheezing or shortness of breath.    . alendronate (FOSAMAX) 70 MG tablet Take 70 mg by mouth once a week. Pt takes on Tuesday.    . calcium-vitamin D (OSCAL WITH D) 500-200 MG-UNIT per tablet Take 1 tablet by mouth 2 (two) times daily.    . carisoprodol (SOMA) 350 MG tablet Take 350 mg by mouth 3 (three) times daily as needed for muscle spasms.     . diltiazem (CARDIZEM) 60 MG tablet Take 60 mg by mouth 2 (two) times daily.    . enoxaparin (LOVENOX) 30 MG/0.3ML injection Inject 0.3 mLs (30 mg total) into the skin daily. 12 Syringe 0  . mirtazapine (REMERON) 7.5 MG tablet Take 7.5 mg by mouth at bedtime.    . oxyCODONE-acetaminophen (PERCOCET/ROXICET) 5-325 MG per tablet Take 0.5-1 tablets by mouth 3 (three) times daily as needed for severe pain. 30 tablet 0  . potassium chloride (K-DUR) 10 MEQ tablet Take 10 mEq by mouth daily.    . tiotropium (SPIRIVA) 18 MCG inhalation capsule Place 18 mcg into inhaler and inhale daily.    . torsemide (DEMADEX) 5 MG tablet Take 5 mg by mouth daily.    . traMADol (ULTRAM) 50 MG tablet Take 1 tablet (50 mg total) by mouth every 8 (eight) hours as needed for moderate pain. 30 tablet 0  . traZODone (DESYREL) 100 MG tablet Take 100 mg by mouth at bedtime.    . aspirin 81 MG chewable tablet Chew 81 mg by mouth daily.    . docusate sodium (COLACE) 100 MG capsule Take 1 capsule (100 mg total) by mouth 2 (two) times daily. (Patient not taking: Reported on 12/10/2014) 10 capsule 0  . predniSONE (DELTASONE) 20 MG tablet Take 20 mg by mouth daily.     No current facility-administered medications for this visit.    OBJECTIVE: PHYSICAL EXAM: GENERAL:  Well developed, well nourished, sitting comfortably in the exam room in no acute distress. MENTAL STATUS:  Alert and oriented to person, place and time. . ENT:  Oropharynx clear without lesion.  Tongue normal. Mucous membranes moist.  RESPIRATORY:  Clear to auscultation without rales, wheezes or rhonchi. CARDIOVASCULAR:  Regular rate and rhythm without murmur, rub or gallop.  ABDOMEN:  Soft, non-tender, with active bowel sounds, and no hepatosplenomegaly.  No masses. BACK:  No CVA tenderness.  No tenderness on percussion of the back or rib cage. SKIN:  No rashes, ulcers or lesions. EXTREMITIES: No edema, no skin discoloration or tenderness.  No palpable cords.   Left hip fracture which is healing. LYMPH NODES: No palpable cervical, supraclavicular, axillary or inguinal adenopathy  NEUROLOGICAL: Unremarkable. PSYCH:  Appropriate.  Filed Vitals:   12/10/14 1159  BP: 174/75  Pulse: 84  Temp: 96.5 F (35.8 C)     There is no weight on file to calculate BMI.    ECOG FS:2 - Symptomatic, <50% confined to bed  LAB RESULTS:  No visits with results within 5 Day(s) from this visit. Latest known visit with results is:  Admission on 11/29/2014, Discharged on 12/03/2014  Component Date Value Ref Range Status  . Sodium 11/29/2014 116* 135 - 145 mmol/L Final   Comment: CRITICAL RESULT CALLED TO, READ BACK BY AND VERIFIED WITH AMANDA LOVETT AT 1855 ON 11/29/14 RWW   . Potassium 11/29/2014 3.8  3.5 - 5.1   mmol/L Final  . Chloride 11/29/2014 73* 101 - 111 mmol/L Final  . CO2 11/29/2014 29  22 - 32 mmol/L Final  . Glucose, Bld 11/29/2014 73  65 - 99 mg/dL Final  . BUN 11/29/2014 <5* 6 - 20 mg/dL Final  . Creatinine, Ser 11/29/2014 0.36* 0.44 - 1.00 mg/dL Final  . Calcium 11/29/2014 8.4* 8.9 - 10.3 mg/dL Final  . GFR calc non Af Amer 11/29/2014 >60  >60 mL/min Final  . GFR calc Af Amer 11/29/2014 >60  >60 mL/min Final   Comment: (NOTE) The eGFR has been calculated using the CKD EPI equation. This calculation has not been validated in all clinical situations. eGFR's persistently <60 mL/min signify possible Chronic Kidney Disease.   . Anion gap 11/29/2014 14  5 - 15 Final  . Alcohol, Ethyl (B) 11/29/2014 150* <5 mg/dL Final   Comment:        LOWEST DETECTABLE LIMIT FOR SERUM ALCOHOL IS 5 mg/dL FOR MEDICAL PURPOSES ONLY   . WBC 11/29/2014 7.2  3.6 - 11.0 K/uL Final  . RBC 11/29/2014 3.52* 3.80 - 5.20 MIL/uL Final  . Hemoglobin 11/29/2014 12.6  12.0 - 16.0 g/dL Final  . HCT 11/29/2014 35.7  35.0 - 47.0 % Final  . MCV 11/29/2014 101.6* 80.0 - 100.0 fL Final  . MCH 11/29/2014 35.8* 26.0 - 34.0 pg Final  . MCHC 11/29/2014 35.2  32.0 - 36.0 g/dL Final    . RDW 11/29/2014 12.6  11.5 - 14.5 % Final  . Platelets 11/29/2014 434  150 - 440 K/uL Final  . Troponin I 11/29/2014 <0.03  <0.031 ng/mL Final   Comment:        NO INDICATION OF MYOCARDIAL INJURY.   . Prothrombin Time 11/29/2014 13.1  11.4 - 15.0 seconds Final  . INR 11/29/2014 0.97   Final  . Specimen Description 11/29/2014 URINE, RANDOM   Final  . Special Requests 11/29/2014 NONE   Final  . Culture 11/29/2014 NO GROWTH 2 DAYS   Final  . Report Status 11/29/2014 12/01/2014 FINAL   Final  . aPTT 11/29/2014 35  24 - 36 seconds Final  . Albumin 11/29/2014 3.8  3.5 - 5.0 g/dL Final  . Vit D, 25-Hydroxy 11/29/2014 55.9  30.0 - 100.0 ng/mL Final   Comment: (NOTE) Vitamin D deficiency has been defined by the Institute of Medicine and an Endocrine Society practice guideline as a level of serum 25-OH vitamin D less than 20 ng/mL (1,2). The Endocrine Society went on to further define vitamin D insufficiency as a level between 21 and 29 ng/mL (2). 1. IOM (Institute of Medicine). 2010. Dietary reference   intakes for calcium and D. [place]: The   National Academies Press. 2. Holick MF, Binkley De Leon, Bischoff-Ferrari HA, et al.   Evaluation, treatment, and prevention of vitamin D   deficiency: an Endocrine Society clinical practice   guideline. JCEM. 2011 Jul; 96(7):1911-30. Performed At: [facility] [address] [place], [place] [id number] [place] William F MD Ph:[phone]   . WBC 11/30/2014 5.5  3.6 - 11.0 K/uL Final  . RBC 11/30/2014 3.19* 3.80 - 5.20 MIL/uL Final  . Hemoglobin 11/30/2014 11.9* 12.0 - 16.0 g/dL Final  . HCT 11/30/2014 32.8* 35.0 - 47.0 % Final  . MCV 11/30/2014 102.8* 80.0 - 100.0 fL Final  . MCH 11/30/2014 37.3* 26.0 - 34.0 pg Final  . MCHC 11/30/2014 36.3* 32.0 - 36.0 g/dL Final  . RDW 11/30/2014 12.8  11.5 - 14.5 % Final  .   Platelets 11/30/2014 365  150 - 440 K/uL Final  . Sodium 11/30/2014 124* 135 - 145 mmol/L Final  . Potassium 11/30/2014 3.5   3.5 - 5.1 mmol/L Final  . Chloride 11/30/2014 87* 101 - 111 mmol/L Final  . CO2 11/30/2014 28  22 - 32 mmol/L Final  . Glucose, Bld 11/30/2014 68  65 - 99 mg/dL Final  . BUN 11/30/2014 <5* 6 - 20 mg/dL Final  . Creatinine, Ser 11/30/2014 0.41* 0.44 - 1.00 mg/dL Final  . Calcium 11/30/2014 8.1* 8.9 - 10.3 mg/dL Final  . GFR calc non Af Amer 11/30/2014 >60  >60 mL/min Final  . GFR calc Af Amer 11/30/2014 >60  >60 mL/min Final   Comment: (NOTE) The eGFR has been calculated using the CKD EPI equation. This calculation has not been validated in all clinical situations. eGFR's persistently <60 mL/min signify possible Chronic Kidney Disease.   . Anion gap 11/30/2014 9  5 - 15 Final  . ABO/RH(D) 11/29/2014 O POS   Final  . Antibody Screen 11/29/2014 NEG   Final  . Sample Expiration 11/29/2014 12/02/2014   Final  . ABO/RH(D) 11/29/2014 O POS   Final  . Osmolality, Ur 11/29/2014 182* 300 - 900 mOsm/kg Final  . Osmolality 11/29/2014 261* 275 - 300 mOsm/kg Final  . Sodium, Ur 11/29/2014 44   Final  . MRSA, PCR 11/30/2014 NEGATIVE  NEGATIVE Final  . Staphylococcus aureus 11/30/2014 NEGATIVE  NEGATIVE Final   Comment:        The Xpert SA Assay (FDA approved for NASAL specimens in patients over 27 years of age), is one component of a comprehensive surveillance program.  Test performance has been validated by [facility] for patients greater than or equal to 54 year old. It is not intended to diagnose infection nor to guide or monitor treatment.   . Sodium 11/30/2014 125* 135 - 145 mmol/L Final  . Potassium 11/30/2014 4.1  3.5 - 5.1 mmol/L Final  . Chloride 11/30/2014 87* 101 - 111 mmol/L Final  . CO2 11/30/2014 23  22 - 32 mmol/L Final  . Glucose, Bld 11/30/2014 110* 65 - 99 mg/dL Final  . BUN 11/30/2014 8  6 - 20 mg/dL Final  . Creatinine, Ser 11/30/2014 0.60  0.44 - 1.00 mg/dL Final  . Calcium 11/30/2014 8.6* 8.9 - 10.3 mg/dL Final  . GFR calc non Af Amer 11/30/2014 >60  >60 mL/min  Final  . GFR calc Af Amer 11/30/2014 >60  >60 mL/min Final   Comment: (NOTE) The eGFR has been calculated using the CKD EPI equation. This calculation has not been validated in all clinical situations. eGFR's persistently <60 mL/min signify possible Chronic Kidney Disease.   . Anion gap 11/30/2014 15  5 - 15 Final  . Sodium 12/01/2014 128* 135 - 145 mmol/L Final  . Potassium 12/01/2014 3.5  3.5 - 5.1 mmol/L Final  . Chloride 12/01/2014 92* 101 - 111 mmol/L Final  . CO2 12/01/2014 28  22 - 32 mmol/L Final  . Glucose, Bld 12/01/2014 163* 65 - 99 mg/dL Final  . BUN 12/01/2014 16  6 - 20 mg/dL Final  . Creatinine, Ser 12/01/2014 0.55  0.44 - 1.00 mg/dL Final  . Calcium 12/01/2014 8.8* 8.9 - 10.3 mg/dL Final  . GFR calc non Af Amer 12/01/2014 >60  >60 mL/min Final  . GFR calc Af Amer 12/01/2014 >60  >60 mL/min Final   Comment: (NOTE) The eGFR has been calculated using the CKD EPI equation. This calculation  has not been validated in all clinical situations. eGFR's persistently <60 mL/min signify possible Chronic Kidney Disease.   . Anion gap 12/01/2014 8  5 - 15 Final  . WBC 12/01/2014 8.5  3.6 - 11.0 K/uL Final  . RBC 12/01/2014 2.94* 3.80 - 5.20 MIL/uL Final  . Hemoglobin 12/01/2014 10.8* 12.0 - 16.0 g/dL Final  . HCT 12/01/2014 30.6* 35.0 - 47.0 % Final  . MCV 12/01/2014 104.0* 80.0 - 100.0 fL Final  . MCH 12/01/2014 36.8* 26.0 - 34.0 pg Final  . MCHC 12/01/2014 35.4  32.0 - 36.0 g/dL Final  . RDW 12/01/2014 12.6  11.5 - 14.5 % Final  . Platelets 12/01/2014 367  150 - 440 K/uL Final  . Sodium 12/01/2014 129* 135 - 145 mmol/L Final  . Sodium 12/01/2014 132* 135 - 145 mmol/L Final  . Potassium 12/01/2014 3.5  3.5 - 5.1 mmol/L Final  . Chloride 12/01/2014 94* 101 - 111 mmol/L Final  . CO2 12/01/2014 28  22 - 32 mmol/L Final  . Glucose, Bld 12/01/2014 123* 65 - 99 mg/dL Final  . BUN 12/01/2014 7  6 - 20 mg/dL Final  . Creatinine, Ser 12/01/2014 0.49  0.44 - 1.00 mg/dL Final  .  Calcium 12/01/2014 8.3* 8.9 - 10.3 mg/dL Final  . GFR calc non Af Amer 12/01/2014 >60  >60 mL/min Final  . GFR calc Af Amer 12/01/2014 >60  >60 mL/min Final   Comment: (NOTE) The eGFR has been calculated using the CKD EPI equation. This calculation has not been validated in all clinical situations. eGFR's persistently <60 mL/min signify possible Chronic Kidney Disease.   . Anion gap 12/01/2014 10  5 - 15 Final  . WBC 12/02/2014 8.3  3.6 - 11.0 K/uL Final  . RBC 12/02/2014 2.36* 3.80 - 5.20 MIL/uL Final  . Hemoglobin 12/02/2014 9.0* 12.0 - 16.0 g/dL Final  . HCT 12/02/2014 24.8* 35.0 - 47.0 % Final  . MCV 12/02/2014 104.7* 80.0 - 100.0 fL Final  . MCH 12/02/2014 38.2* 26.0 - 34.0 pg Final  . MCHC 12/02/2014 36.5* 32.0 - 36.0 g/dL Final  . RDW 12/02/2014 12.7  11.5 - 14.5 % Final  . Platelets 12/02/2014 329  150 - 440 K/uL Final  . Sodium 12/02/2014 133* 135 - 145 mmol/L Final  . Potassium 12/02/2014 2.8* 3.5 - 5.1 mmol/L Final   Comment: RESULTS VERIFIED BY REPEAT TESTING CRITICAL RESULT CALLED TO, READ BACK BY AND VERIFIED WITH IVIE FRYAR AT 0548 12/02/14.PMH   . Chloride 12/02/2014 100* 101 - 111 mmol/L Final  . CO2 12/02/2014 25  22 - 32 mmol/L Final  . Glucose, Bld 12/02/2014 112* 65 - 99 mg/dL Final  . BUN 12/02/2014 <5* 6 - 20 mg/dL Final  . Creatinine, Ser 12/02/2014 0.52  0.44 - 1.00 mg/dL Final  . Calcium 12/02/2014 7.0* 8.9 - 10.3 mg/dL Final  . GFR calc non Af Amer 12/02/2014 >60  >60 mL/min Final  . GFR calc Af Amer 12/02/2014 >60  >60 mL/min Final   Comment: (NOTE) The eGFR has been calculated using the CKD EPI equation. This calculation has not been validated in all clinical situations. eGFR's persistently <60 mL/min signify possible Chronic Kidney Disease.   . Anion gap 12/02/2014 8  5 - 15 Final  . TSH 12/02/2014 1.476  0.350 - 4.500 uIU/mL Final  . Cortisol, Plasma 12/02/2014 10.1   Final   Comment: (NOTE) AM    6.7 - 22.6 ug/dL PM   <10.0          ug/dL Performed at [facility]   . Sodium 12/03/2014 131* 135 - 145 mmol/L Final  . Potassium 12/03/2014 4.1  3.5 - 5.1 mmol/L Final  . Chloride 12/03/2014 98* 101 - 111 mmol/L Final  . CO2 12/03/2014 26  22 - 32 mmol/L Final  . Glucose, Bld 12/03/2014 98  65 - 99 mg/dL Final  . BUN 12/03/2014 5* 6 - 20 mg/dL Final  . Creatinine, Ser 12/03/2014 0.46  0.44 - 1.00 mg/dL Final  . Calcium 12/03/2014 8.0* 8.9 - 10.3 mg/dL Final  . GFR calc non Af Amer 12/03/2014 >60  >60 mL/min Final  . GFR calc Af Amer 12/03/2014 >60  >60 mL/min Final   Comment: (NOTE) The eGFR has been calculated using the CKD EPI equation. This calculation has not been validated in all clinical situations. eGFR's persistently <60 mL/min signify possible Chronic Kidney Disease.   . Anion gap 12/03/2014 7  5 - 15 Final  . Magnesium 12/03/2014 1.4* 1.7 - 2.4 mg/dL Final  . WBC 12/03/2014 6.3  3.6 - 11.0 K/uL Final  . RBC 12/03/2014 2.58* 3.80 - 5.20 MIL/uL Final  . Hemoglobin 12/03/2014 9.3* 12.0 - 16.0 g/dL Final  . HCT 12/03/2014 27.1* 35.0 - 47.0 % Final  . MCV 12/03/2014 104.8* 80.0 - 100.0 fL Final  . MCH 12/03/2014 36.0* 26.0 - 34.0 pg Final  . MCHC 12/03/2014 34.4  32.0 - 36.0 g/dL Final  . RDW 12/03/2014 12.6  11.5 - 14.5 % Final  . Platelets 12/03/2014 380  150 - 440 K/uL Final     STUDIES: IMPRESSION: 1. There is intense FDG uptake associated with the right lower lobe central mass which is worrisome for primary bronchogenic carcinoma. 2. No evidence for hypermetabolic mediastinal or hilar adenopathy and no evidence for distant metastatic disease. 3. Emphysema 4. Aortic atherosclerosis. Dg Chest 1 View  12/01/2014   CLINICAL DATA:  Shortness of breath history of asthma and COPD, patient smokes  EXAM: CHEST  1 VIEW  COMPARISON:  11/29/2014  FINDINGS: Heart size and vascular pattern normal. Hyperinflation suggests COPD. No consolidation effusion or pneumothorax. Known infrahilar right lung mass  stable.  IMPRESSION: COPD.  Known right lung mass stable.   Electronically Signed   By: Skipper Cliche M.D.   On: 12/01/2014 08:01   Dg Chest 1 View  11/29/2014   CLINICAL DATA:  Pt fell from standing today onto kitchen floor. Injuring left side. pts c/o left hip pain, left leg is externally rotated. No previous surgeries.  EXAM: CHEST  1 VIEW  COMPARISON:  Chest CT, 11/04/2014  FINDINGS: Right infrahilar mass is noted as better defined on the recent CT. There is no lung consolidation or edema. No pleural effusion or pneumothorax.  Cardiac silhouette is normal in size. No mediastinal masses or convincing adenopathy. Left hilum is unremarkable. New  Bones are demineralized. Compression fractures are noted of the thoracic spine as were present on the prior CT.  IMPRESSION: 1. No acute cardiopulmonary disease. 2. Right lower lobe mass. Please refer to the recent prior CT scan of the chest report. 3. Multiple thoracic compression fractures.   Electronically Signed   By: Lajean Manes M.D.   On: 11/29/2014 18:26   Ct Hip Left Wo Contrast  11/29/2014   EXAM: CT OF THE LEFT HIP WITHOUT CONTRAST  TECHNIQUE: Multidetector CT imaging of the left hip was performed according to the standard protocol. Multiplanar CT image reconstructions were also generated.  COMPARISON:  Current left hip radiograph  showing a left proximal femur fracture.  FINDINGS: As noted on standard radiographs, there is a displaced comminuted intertrochanteric fracture of the proximal femur. Primary fracture line extends from the medial base of the femoral neck through the greater trochanter. There are additional fracture fragments from the lesser and greater trochanters. Major fracture fragments displaced, with the base of the femoral neck displacing lateral to the shaft component by 15 mm. Fracture displacement is also anterior measuring approximate 12 mm. This leads to mild apex anterior angulation. There is also mild varus angulation. The major  fracture components are impacted, again by approximate 1.5 cm.  There are no other fractures. The left hip joint is normally aligned.  There is a left hip joint effusion consistent with hemarthrosis, small to moderate in size.  Surrounding muscular structures are unremarkable.  Within the pelvis, Foley catheter partly decompresses the bladder. There are vascular calcifications. No masses or adenopathy. No ascites or abnormal fluid collections.  IMPRESSION: 1. Mildly displaced, impacted and comminuted intratrochanteric fracture of left proximal femur as detailed above.   Electronically Signed   By: Lajean Manes M.D.   On: 11/29/2014 19:50   Nm Pet Image Initial (pi) Skull Base To Thigh  11/14/2014   CLINICAL DATA:  Initial treatment strategy for lung mass.  EXAM: NUCLEAR MEDICINE PET SKULL BASE TO THIGH  TECHNIQUE: 12.36 mCi F-18 FDG was injected intravenously. Full-ring PET imaging was performed from the skull base to thigh after the radiotracer. CT data was obtained and used for attenuation correction and anatomic localization.  FASTING BLOOD GLUCOSE:  Value: 81 mg/dl  COMPARISON:  11/04/2014  FINDINGS: NECK  No hypermetabolic lymph nodes in the neck.  CHEST  No hypermetabolic mediastinal or hilar nodes. Central, right lower lobe mass is identified measuring 3.1 cm, image 92 of series 3. The SUV max associated with this mass is equal to 11.15. No hypermetabolic hilar or mediastinal adenopathy. Mild changes of centrilobular emphysema identified. Aortic atherosclerosis noted.  ABDOMEN/PELVIS  No abnormal hypermetabolic activity within the liver, pancreas, adrenal glands, or spleen. No hypermetabolic lymph nodes in the abdomen or pelvis.  SKELETON  No focal hypermetabolic activity to suggest skeletal metastasis. Previous hardware fixation of the proximal right femur. Again noted are compression deformities involving the thoracic and lumbar spine. No hypermetabolic bone lesions identified.  IMPRESSION: 1. There is  intense FDG uptake associated with the right lower lobe central mass which is worrisome for primary bronchogenic carcinoma. 2. No evidence for hypermetabolic mediastinal or hilar adenopathy and no evidence for distant metastatic disease. 3. Emphysema 4. Aortic atherosclerosis.   Electronically Signed   By: Kerby Moors M.D.   On: 11/14/2014 10:46   Chest Portable 1 View  11/29/2014   CLINICAL DATA:  Hip fracture.  Preop study.  EXAM: PORTABLE CHEST - 1 VIEW  COMPARISON:  11/29/2014 at 1803 hours  FINDINGS: No change from the earlier exam. There is an infrahilar mass is noted previously, better evaluated on the chest CT dated 11/04/2014.  There is no lung consolidation or edema. No pleural effusion or pneumothorax.  Cardiac silhouette is normal in size. No mediastinal mass or convincing adenopathy.  Bony thorax is demineralized. There are old healed rib fractures on right.  IMPRESSION: No acute cardiopulmonary disease.  Right lower lobe, infrahilar mass.   Electronically Signed   By: Lajean Manes M.D.   On: 11/29/2014 19:44   Dg Hip Unilat With Pelvis 1v Left  12/01/2014   CLINICAL DATA:  Post LEFT hip  operative repair today  EXAM: DG HIP (WITH OR WITHOUT PELVIS) 1V*L*  COMPARISON:  11/29/2014  FINDINGS: Interval placement of IM nail with proximal compression screw at the LEFT femur post ORIF of a reduced intertrochanteric LEFT femoral fracture.  No dislocation.  Lateral soft tissue gas throughout the thigh.  Distal locking screw present at the IM nail.  IM nail with compression screw at proximal RIGHT femur unchanged.  Symmetric hip and SI joints.  Old healed inferior LEFT pubic ramus fracture.  Scattered atherosclerotic calcification and pelvic the lips.  IMPRESSION: Post ORIF of a reduced intertrochanteric LEFT femoral fracture.   Electronically Signed   By: Mark  Boles M.D.   On: 12/01/2014 18:25   Dg Hip Operative Unilat With Pelvis Left  12/01/2014   CLINICAL DATA:  LEFT hip surgery  EXAM: OPERATIVE  LEFT HIP (WITH PELVIS IF PERFORMED)  VIEWS  TECHNIQUE: Fluoroscopic spot image(s) were submitted for interpretation post-operatively.  FLUOROSCOPY TIME:  0 minutes 49 seconds  Number of images: 4  COMPARISON:  CT LEFT hip 11/29/2014  FINDINGS: Osseous demineralization.  Placement of a long IM nail with proximal compression screw at the proximal LEFT femur post ORIF of a reduced intertrochanteric fracture.  No additional fracture or dislocation.  Distal locking screw present at IM nail.  IMPRESSION: ORIF of an intertrochanteric fracture of LEFT femur.   Electronically Signed   By: Mark  Boles M.D.   On: 12/01/2014 17:43   Dg Hip Unilat With Pelvis 2-3 Views Left  11/29/2014   CLINICAL DATA:  Hip pain after falling today.  Initial encounter.  EXAM: DG HIP (WITH OR WITHOUT PELVIS) 2-3V LEFT  COMPARISON:  PET-CT 11/14/2014.  FINDINGS: The bones are diffusely demineralized. There is an acute comminuted and mildly angulated intertrochanteric femur fracture on the left. Old fracture of the left inferior pubic ramus appears unchanged from prior CT. Patient is status post right femoral neck dynamic screw and intramedullary rod fixation. No evidence of dislocation. Mild sacroiliac and lower lumbar spine degenerative changes are present. There are diffuse vascular calcifications.  IMPRESSION: Acute, comminuted and angulated intertrochanteric left femur fracture.   Electronically Signed   By: William  Veazey M.D.   On: 11/29/2014 18:25    ASSESSMENT: . Right lower lobe lung mass PET scan and CT scan has been reviewed independently and reviewed with the patient 2, COPD 3.  Multiple vertebral compression fracture.  Osteoporosis patient has been started on Fosamax 4.  Right hip fracture status post internal fixation on November 29, 2014 Patient is on Lovenox undergoing rehabilitation therapy at home Making progress in terms of ambulation 5.  Diabetes 6.  Hyponatremia has been noticed at the time of admission in the  hospital in August 5 which has been later on corrected PLAN:   I discussed the situation with patient.  Will get CT scan and PET scan evaluated by pulmonologist to see whether any bronchoscopy or EUS would be useful.  Patient has to come off Lovenox prior to biopsy.  Patient and family desires to wait until the patient starts ambulating somewhat better and come off Lovenox and recover from the hip surgery. I also discussed situation with thoracic oncology surgeon Dr. Oakes. Our lung cancer navigator Mr. Perkins will let me know about pulmonary evaluation   Patient expressed understanding and was in agreement with this plan. She also understands that She can call clinic at any time with any questions, concerns, or complaints.    No matching staging information was found   for the patient.  Jaidee Stipe, MD   12/11/2014 8:10 AM      

## 2014-12-26 NOTE — Op Note (Signed)
  PROCEDURE: BRONCHOSCOPY Therapeutic Aspiration of Tracheobronchial Tree  PROCEDURE DATE: 12/26/2014  TIME:  NAME:  Rachael Palmer  DOB:Jan 04, 1956  MRN: [id number] LOC:  ARPO/None    HOSP DAY: '@LENGTHOFSTAYDAYS'$ @ CODE STATUS:        Indications/Preliminary Diagnosis:   Consent: (Place X beside choice/s below)  The benefits, risks and possible complications of the procedure were        explained to:  _x__ patient  _x__ patient's family  ___ other:___________  who verbalized understanding and gave:  ___ verbal  ___ written  _x__ verbal and written  ___ telephone  ___ other:________ consent.      Unable to obtain consent; procedure performed on emergent basis.     Other:       PRESEDATION ASSESSMENT: History and Physical has been performed. Patient meds and allergies have been reviewed. Presedation airway examination has been performed and documented. Baseline vital signs, sedation score, oxygenation status, and cardiac rhythm were reviewed. Patient was deemed to be in satisfactory condition to undergo the procedure.    PREMEDICATIONS: See Anesthesiology records  Sedative/Narcotic Amt Dose   Versed  mg   Fentanyl  mcg  Diprivan  mg            PROCEDURE DETAILS: Timeout performed and correct patient, name, & ID confirmed. Following prep per Pulmonary policy, appropriate sedation was administered. The Bronchoscope was inserted in to oral cavity with bite block in place. Therapeutic aspiration of Tracheobronchial tree was performed.  Airway exam proceeded with findings, technical procedures, and specimen collection as noted below. At the end of exam the scope was withdrawn without incident. Impression and Plan as noted below.           Airway Prep (Place X beside choice below)   1% Transtracheal Lidocaine Anesthetization 7 cc   Patient prepped per Bronchoscopy Lab Policy       Insertion Route (Place X beside choice below)   Nasal   Oral  x Endotracheal Tube   Tracheostomy   INTRAPROCEDURE MEDICATIONS:  Sedative/Narcotic Amt Dose   Versed  mg   Fentanyl  mcg  Diprivan  mg       Medication Amt Dose  Medication Amt Dose  Lidocaine 1%  cc  Epinephrine 1:10,000 sol  cc  Xylocaine 4%  cc  Cocaine  cc   TECHNICAL PROCEDURES: (Place X beside choice below)   Procedures  Description    None     Electrocautery     Cryotherapy     Balloon Dilatation     Bronchography     Stent Placement     Therapeutic Aspiration     Laser/Argon Plasma    Brachytherapy Catheter Placement    Foreign Body Removal         SPECIMENS (Sites): (Place X beside choice below)  Specimens Description   No Specimens Obtained     Washings    Lavage   x ENDOBRONCHIAL Biopsies 6  x TRANSBRONCHIAL  Fine Needle Aspirates 5   Brushings    Sputum    FINDINGS: LARGE RT LOWER ENDOBRONCHIAL LUNG MASS, COMPLETE OBSTRUCTION OF RLL, FUNGATING-INFLAMMATORY/EDEMA ED_EASILY BLEEDS ESTIMATED BLOOD LOSS: none COMPLICATIONS/RESOLUTION: none      IMPRESSION:POST-PROCEDURE DX: malignant    RECOMMENDATION/PLAN: follow up pathology reports     Rachael Palmer Patricia Pesa, M.D.  [facility]  Medical Director [place] Director [place] Department

## 2014-12-26 NOTE — Interval H&P Note (Signed)
History and Physical Interval Note:  12/26/2014 2:19 PM  Rachael Palmer  has presented today for surgery, with the diagnosis of nodule  The various methods of treatment have been discussed with the patient and family. After consideration of risks, benefits and other options for treatment, the patient has consented to  Procedure(s): ELECTROMAGNETIC NAVIGATION BRONCHOSCOPY (N/A) as a surgical intervention .  The patient's history has been reviewed, patient examined, no change in status, stable for surgery.  I have reviewed the patient's chart and labs.  Questions were answered to the patient's satisfaction.     Flora Lipps

## 2014-12-26 NOTE — Anesthesia Preprocedure Evaluation (Signed)
Anesthesia Evaluation  Patient identified by MRN, date of birth, ID band Patient awake    Reviewed: Allergy & Precautions, H&P , NPO status , Patient's Chart, lab work & pertinent test results, reviewed documented beta blocker date and time   History of Anesthesia Complications Negative for: history of anesthetic complications  Airway Mallampati: III  TM Distance: >3 FB Neck ROM: full    Dental no notable dental hx. (+) Poor Dentition, Partial Upper   Pulmonary neg shortness of breath, asthma , neg sleep apnea, COPD COPD inhaler, neg recent URI, Current Smoker,  breath sounds clear to auscultation  Pulmonary exam normal       Cardiovascular Exercise Tolerance: Good hypertension, On Medications and On Home Beta Blockers - angina- CAD, - Past MI, - Cardiac Stents and - CABG - dysrhythmias - Valvular Problems/MurmursRhythm:regular Rate:Normal     Neuro/Psych negative neurological ROS  negative psych ROS   GI/Hepatic negative GI ROS, (+)     substance abuse  alcohol use,   Endo/Other  negative endocrine ROS  Renal/GU negative Renal ROS  negative genitourinary   Musculoskeletal   Abdominal   Peds  Hematology negative hematology ROS (+)   Anesthesia Other Findings   Reproductive/Obstetrics negative OB ROS                             Anesthesia Physical  Anesthesia Plan  ASA: III and emergent  Anesthesia Plan: Spinal   Post-op Pain Management:    Induction:   Airway Management Planned:   Additional Equipment:   Intra-op Plan:   Post-operative Plan:   Informed Consent: I have reviewed the patients History and Physical, chart, labs and discussed the procedure including the risks, benefits and alternatives for the proposed anesthesia with the patient or authorized representative who has indicated his/her understanding and acceptance.   Dental Advisory Given  Plan Discussed with:  CRNA  Anesthesia Plan Comments:         Anesthesia Quick Evaluation

## 2014-12-27 LAB — SURGICAL PATHOLOGY

## 2014-12-31 LAB — CYTOLOGY - NON PAP

## 2015-01-02 ENCOUNTER — Encounter: Payer: Self-pay | Admitting: Oncology

## 2015-01-02 ENCOUNTER — Inpatient Hospital Stay: Payer: Medicare Other | Attending: Oncology | Admitting: Oncology

## 2015-01-02 VITALS — BP 158/85 | HR 75 | Temp 95.4°F | Wt <= 1120 oz

## 2015-01-02 DIAGNOSIS — F17210 Nicotine dependence, cigarettes, uncomplicated: Secondary | ICD-10-CM | POA: Insufficient documentation

## 2015-01-02 DIAGNOSIS — J449 Chronic obstructive pulmonary disease, unspecified: Secondary | ICD-10-CM | POA: Insufficient documentation

## 2015-01-02 DIAGNOSIS — M818 Other osteoporosis without current pathological fracture: Secondary | ICD-10-CM | POA: Diagnosis not present

## 2015-01-02 DIAGNOSIS — I2510 Atherosclerotic heart disease of native coronary artery without angina pectoris: Secondary | ICD-10-CM | POA: Diagnosis not present

## 2015-01-02 DIAGNOSIS — E119 Type 2 diabetes mellitus without complications: Secondary | ICD-10-CM | POA: Insufficient documentation

## 2015-01-02 DIAGNOSIS — Z8781 Personal history of (healed) traumatic fracture: Secondary | ICD-10-CM | POA: Insufficient documentation

## 2015-01-02 DIAGNOSIS — Z7982 Long term (current) use of aspirin: Secondary | ICD-10-CM | POA: Diagnosis not present

## 2015-01-02 DIAGNOSIS — I10 Essential (primary) hypertension: Secondary | ICD-10-CM | POA: Insufficient documentation

## 2015-01-02 DIAGNOSIS — J45909 Unspecified asthma, uncomplicated: Secondary | ICD-10-CM

## 2015-01-02 DIAGNOSIS — F1010 Alcohol abuse, uncomplicated: Secondary | ICD-10-CM | POA: Diagnosis not present

## 2015-01-02 DIAGNOSIS — Z79899 Other long term (current) drug therapy: Secondary | ICD-10-CM | POA: Diagnosis not present

## 2015-01-02 DIAGNOSIS — C3491 Malignant neoplasm of unspecified part of right bronchus or lung: Secondary | ICD-10-CM | POA: Diagnosis not present

## 2015-01-02 DIAGNOSIS — C3431 Malignant neoplasm of lower lobe, right bronchus or lung: Secondary | ICD-10-CM | POA: Insufficient documentation

## 2015-01-02 NOTE — Progress Notes (Signed)
Patient does not have living will.  Current every day smoker.

## 2015-01-03 ENCOUNTER — Encounter: Payer: Self-pay | Admitting: Cardiothoracic Surgery

## 2015-01-03 ENCOUNTER — Ambulatory Visit
Admission: RE | Admit: 2015-01-03 | Discharge: 2015-01-03 | Disposition: A | Discharge status: Home / Self Care | Payer: Medicare Other | Source: Ambulatory Visit | Attending: Radiation Oncology | Admitting: Radiation Oncology

## 2015-01-03 ENCOUNTER — Inpatient Hospital Stay (HOSPITAL_BASED_OUTPATIENT_CLINIC_OR_DEPARTMENT_OTHER): Payer: Medicare Other | Admitting: Cardiothoracic Surgery

## 2015-01-03 ENCOUNTER — Encounter: Payer: Self-pay | Admitting: Radiation Oncology

## 2015-01-03 ENCOUNTER — Encounter: Payer: Self-pay | Admitting: Oncology

## 2015-01-03 VITALS — BP 140/86 | HR 79 | Temp 96.8°F | Resp 22 | Wt <= 1120 oz

## 2015-01-03 DIAGNOSIS — Z510 Encounter for antineoplastic radiation therapy: Secondary | ICD-10-CM | POA: Insufficient documentation

## 2015-01-03 DIAGNOSIS — C3431 Malignant neoplasm of lower lobe, right bronchus or lung: Secondary | ICD-10-CM

## 2015-01-03 DIAGNOSIS — C3491 Malignant neoplasm of unspecified part of right bronchus or lung: Secondary | ICD-10-CM

## 2015-01-03 DIAGNOSIS — J449 Chronic obstructive pulmonary disease, unspecified: Secondary | ICD-10-CM | POA: Insufficient documentation

## 2015-01-03 DIAGNOSIS — F17210 Nicotine dependence, cigarettes, uncomplicated: Secondary | ICD-10-CM | POA: Insufficient documentation

## 2015-01-03 DIAGNOSIS — C3490 Malignant neoplasm of unspecified part of unspecified bronchus or lung: Secondary | ICD-10-CM

## 2015-01-03 DIAGNOSIS — I10 Essential (primary) hypertension: Secondary | ICD-10-CM | POA: Insufficient documentation

## 2015-01-03 HISTORY — DX: Malignant neoplasm of unspecified part of unspecified bronchus or lung: C34.90

## 2015-01-03 NOTE — Progress Notes (Signed)
Patient ID: Rachael Palmer, female   DOB: [birth year], 59 y.o.   MRN: [id number]  Chief Complaint  Patient presents with  . Lung Mass    new consult with Dr. Genevive Bi    Referred By Dr. Ramond Craver he right lower lobe squamous cell carcinoma Reason for Referral right lower lobe squamous cell carcinoma  HPI Location, Quality, Duration, Severity, Timing, Context, Modifying Factors, Associated Signs and Symptoms.  Rachael Palmer is a 59 y.o. female.  I have discussed this patient's care with Dr. Ramond Craver seen Dr. Donella Stade. She is a 59 year old woman who fell from a step stool fracturing her hip. She is admitted to the hospital where she underwent repair of her hip fracture and during that evaluation she was found to have a right lower lobe mass. A subsequent CT scan PET scan and ultimately a bronchoscopy confirmed the presence of a squamous cell carcinoma the right lower lobe. She was seen by Dr. Donella Stade Dr. Ramond Craver he and was felt to be a reasonable candidate for radiation therapy. She comes in today to get my opinion regarding surgical intervention.  The patient has not had any further hemoptysis. She states she did for a few days after the biopsy but none since. She has actually gained a few pounds since she's been home. She states she's been able to get up and walk and does use a walker after her surgery. However she is able to get up and down steps. She unfortunately does continue to smoke and smokes about a quarter pack of cigarettes per day.   Past Medical History  Diagnosis Date  . Asthma   . Hypertension   . Lung mass   . COPD (chronic obstructive pulmonary disease)   . Alcohol abuse   . Mass of lower lobe of right lung 12/11/2014  . Squamous cell carcinoma lung     Past Surgical History  Procedure Laterality Date  . Hip surgery Right 2013  . Femur im nail Left 12/01/2014    Procedure: INTRAMEDULLARY (IM) NAIL FEMORAL;  Surgeon: Thornton Park, MD;  Location: [facility] ORS;  Service: Orthopedics;   Laterality: Left;  . Hip pinning Left 2016  . Cesarean section      x 3  . Broken arm repair Right     Family History  Problem Relation Age of Onset  . Hypertension Mother   . Cirrhosis Father     Social History Social History  Substance Use Topics  . Smoking status: Current Every Day Smoker -- 0.25 packs/day for 40 years    Types: Cigarettes  . Smokeless tobacco: None     Comment: start smoking in late teens  . Alcohol Use: 12.0 oz/week    20 Cans of beer per week     Comment: pt denies use of alcohol as of 11/29/14 "6 pack a day"    Allergies  Allergen Reactions  . Penicillins Hives, Itching and Rash  . Tape Other (See Comments)    Irritation to skin.  Paper tape ok.    Current Outpatient Prescriptions  Medication Sig Dispense Refill  . albuterol (PROVENTIL HFA;VENTOLIN HFA) 108 (90 BASE) MCG/ACT inhaler Inhale 2 puffs into the lungs every 4 (four) hours as needed for wheezing or shortness of breath.    Marland Kitchen alendronate (FOSAMAX) 70 MG tablet Take 70 mg by mouth once a week. Pt takes on Tuesday.    . calcium-vitamin D (OSCAL WITH D) 500-200 MG-UNIT per tablet Take 1 tablet by mouth daily with breakfast.     .  carisoprodol (SOMA) 350 MG tablet Take 350 mg by mouth 3 (three) times daily as needed for muscle spasms.    Marland Kitchen diltiazem (CARDIZEM) 60 MG tablet Take 60 mg by mouth 2 (two) times daily.    Marland Kitchen enoxaparin (LOVENOX) 30 MG/0.3ML injection Inject 0.3 mLs (30 mg total) into the skin daily. 12 Syringe 0  . mirtazapine (REMERON) 7.5 MG tablet Take 7.5 mg by mouth at bedtime.    . potassium chloride (K-DUR) 10 MEQ tablet Take 10 mEq by mouth daily.    Marland Kitchen tiotropium (SPIRIVA) 18 MCG inhalation capsule Place 18 mcg into inhaler and inhale daily.    . traMADol (ULTRAM) 50 MG tablet Take 1 tablet (50 mg total) by mouth every 8 (eight) hours as needed for moderate pain. 30 tablet 0  . traZODone (DESYREL) 100 MG tablet Take 100 mg by mouth at bedtime.    Marland Kitchen aspirin 81 MG chewable tablet  Chew 81 mg by mouth daily.    Marland Kitchen HYDROcodone-acetaminophen (NORCO/VICODIN) 5-325 MG per tablet Take 1 tablet by mouth every 6 (six) hours as needed for moderate pain.     No current facility-administered medications for this visit.      Review of Systems A complete review of systems was asked and was negative except for the following positive findings shortness of breath, frequent cough, wheezing, joint pain, chronic back pain, cold intolerance.  There were no vitals taken for this visit.  Physical Exam CONSTITUTIONAL:  Pleasant, well-developed, well-nourished, and in no acute distress. EYES: Pupils equal and reactive to light, Sclera non-icteric EARS, NOSE, MOUTH AND THROAT:  The oropharynx was clear.  Dentition is good repair.  Oral mucosa pink and moist. LYMPH NODES:  Lymph nodes in the neck and axillae were normal RESPIRATORY:  Lungs were clear.  Normal respiratory effort without pathologic use of accessory muscles of respiration CARDIOVASCULAR: Heart was regular without murmurs.  There were no carotid bruits. GI: The abdomen was soft, nontender, and nondistended. There were no palpable masses. There was no hepatosplenomegaly. There were normal bowel sounds in all quadrants. GU:  Rectal deferred.   MUSCULOSKELETAL:  Normal muscle strength and tone.  Mild clubbing or cyanosis.   SKIN:  There were no pathologic skin lesions.  There were no nodules on palpation. NEUROLOGIC:  Sensation is normal.  Cranial nerves are grossly intact. PSYCH:  Oriented to person, place and time.  Mood and affect are normal.  Data Reviewed I have independently reviewed the patient's CT scan and PET scan  I have personally reviewed the patient's imaging, laboratory findings and medical records.    Assessment    I had a long discussion with the patient and her family today. It would appear that she has a stage I carcinoma the lung. I told her that I thought urge go intervention carried the best long-term  survival rates although radiation therapy certainly was a very good alternative to those who could not or would not have surgery. The patient states that she's already made up her mind would like to have radiation therapy. She does not wish to pursue any surgical intervention.    Plan    After extensive discussion with the patient and her family I reviewed with him the indications and risks of surgery versus radiation therapy. They would like pursue radiation therapy. They understand that once radiation therapy is administered surgical intervention is not possible. No follow-up was made at this time for me but we will see how the patient follow-up with  Dr. Donella Stade for definitive radiotherapy.       Nestor Lewandowsky, MD 01/03/2015, 11:03 AM

## 2015-01-03 NOTE — Consult Note (Signed)
Except an outstanding is perfect of Radiation Oncology NEW PATIENT EVALUATION  Name: Rachael Palmer  MRN: [id number]  Date:   01/03/2015     DOB: [birth year]   This 59 y.o. 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  REFERRING PHYSICIAN: Kirk Ruths, MD  CHIEF COMPLAINT:  Chief Complaint  Patient presents with  . Lung Cancer    Initial consult    DIAGNOSIS: The encounter diagnosis was Squamous cell carcinoma lung, right.   PREVIOUS INVESTIGATIONS:  CT scan PET CT scans reviewed Clinical notes reviewed Surgical pathology report reviewed  HPI: Patient is a 59 year old female chronic smoker who had some upper respiratory symptoms was found by her PMD Abnormal x-ray CT scan confirmed a right lower lobe mass. PET scan was positive showing significant hypermetabolic activity in this lesion no evidence to suggest mediastinal adenopathy. Patient has significant severe osteoporosis with multiple vertebral compression fractures. Also has significant COPD and diabetes. After initial CT scan she developed a fracture of her left hip necessitating a clinical pinning. Patient is also significant EtOH use history. Bronchoscopy revealed squamous cell carcinoma. She's been presented at our weekly tumor conference recommendation was made based on her multiple comorbidities and pulmonary functions to not be an ideal surgical candidate. She seen today for consideration of radiation therapy. She is doing fairly well has a mild nonproductive cough no hemoptysis. By mouth intake is good no dysphagia.  PLANNED TREATMENT REGIMEN: Possible SB RT  PAST MEDICAL HISTORY:  has a past medical history of Asthma; Hypertension; Lung mass; COPD (chronic obstructive pulmonary disease); Alcohol abuse; and Mass of lower lobe of right lung (12/11/2014).    PAST SURGICAL HISTORY:  Past Surgical History  Procedure Laterality Date  . Hip  surgery Right 2013  . Femur im nail Left 12/01/2014    Procedure: INTRAMEDULLARY (IM) NAIL FEMORAL;  Surgeon: Thornton Park, MD;  Location: [facility] ORS;  Service: Orthopedics;  Laterality: Left;  . Hip pinning Left 2016    FAMILY HISTORY: family history includes Cirrhosis in her father; Hypertension in her mother.  SOCIAL HISTORY:  reports that she has been smoking Cigarettes.  She has a 10 pack-year smoking history. She does not have any smokeless tobacco history on file. She reports that she drinks about 12.0 oz of alcohol per week. She reports that she does not use illicit drugs.  ALLERGIES: Penicillins and Tape  MEDICATIONS:  Current Outpatient Prescriptions  Medication Sig Dispense Refill  . albuterol (PROVENTIL HFA;VENTOLIN HFA) 108 (90 BASE) MCG/ACT inhaler Inhale 2 puffs into the lungs every 4 (four) hours as needed for wheezing or shortness of breath.    Marland Kitchen alendronate (FOSAMAX) 70 MG tablet Take 70 mg by mouth once a week. Pt takes on Tuesday.    Marland Kitchen aspirin 81 MG chewable tablet Chew 81 mg by mouth daily.    . calcium-vitamin D (OSCAL WITH D) 500-200 MG-UNIT per tablet Take 1 tablet by mouth daily with breakfast.     . carisoprodol (SOMA) 350 MG tablet Take 350 mg by mouth 3 (three) times daily as needed for muscle spasms.    Marland Kitchen diltiazem (CARDIZEM) 60 MG tablet Take 60 mg by mouth 2 (two) times daily.    Marland Kitchen docusate sodium (COLACE) 100 MG capsule Take 1 capsule (100 mg total) by mouth 2 (two) times daily. 10 capsule 0  . enoxaparin (LOVENOX) 30 MG/0.3ML injection Inject 0.3 mLs (30 mg total)  into the skin daily. 12 Syringe 0  . mirtazapine (REMERON) 7.5 MG tablet Take 7.5 mg by mouth at bedtime.    Marland Kitchen oxyCODONE-acetaminophen (PERCOCET/ROXICET) 5-325 MG per tablet Take 0.5-1 tablets by mouth 3 (three) times daily as needed for severe pain. 30 tablet 0  . potassium chloride (K-DUR) 10 MEQ tablet Take 10 mEq by mouth daily.    . predniSONE (DELTASONE) 20 MG tablet Take 20 mg by mouth daily.     Marland Kitchen tiotropium (SPIRIVA) 18 MCG inhalation capsule Place 18 mcg into inhaler and inhale daily.    Marland Kitchen torsemide (DEMADEX) 5 MG tablet Take 5 mg by mouth daily.    . traMADol (ULTRAM) 50 MG tablet Take 1 tablet (50 mg total) by mouth every 8 (eight) hours as needed for moderate pain. 30 tablet 0  . traZODone (DESYREL) 100 MG tablet Take 100 mg by mouth at bedtime.     No current facility-administered medications for this encounter.    ECOG PERFORMANCE STATUS:  0 - Asymptomatic  REVIEW OF SYSTEMS: Except fourth her multiple problems associated with her lung cancer, diabetes and osteoporosis Patient denies any weight loss, fatigue, weakness, fever, chills or night sweats. Patient denies any loss of vision, blurred vision. Patient denies any ringing  of the ears or hearing loss. No irregular heartbeat. Patient denies heart murmur or history of fainting. Patient denies any chest pain or pain radiating to her upper extremities. Patient denies any shortness of breath, difficulty breathing at night, cough or hemoptysis. Patient denies any swelling in the lower legs. Patient denies any nausea vomiting, vomiting of blood, or coffee ground material in the vomitus. Patient denies any stomach pain. Patient states has had normal bowel movements no significant constipation or diarrhea. Patient denies any dysuria, hematuria or significant nocturia. Patient denies any problems walking, swelling in the joints or loss of balance. Patient denies any skin changes, loss of hair or loss of weight. Patient denies any excessive worrying or anxiety or significant depression. Patient denies any problems with insomnia. Patient denies excessive thirst, polyuria, polydipsia. Patient denies any swollen glands, patient denies easy bruising or easy bleeding. Patient denies any recent infections, allergies or URI. Patient "s visual fields have not changed significantly in recent time.    PHYSICAL EXAM: BP 140/86 mmHg  Pulse 79   Temp(Src) 96.8 F (36 C)  Resp 22  Wt 68 lb 9 oz (31.1 kg) Thin female appears much older than stated age in NAD. She is status post recent hip surgery with incision healing well. No cervical supraclavicular or axillary adenopathy is appreciated. Well-developed well-nourished patient in NAD. HEENT reveals PERLA, EOMI, discs not visualized.  Oral cavity is clear. No oral mucosal lesions are identified. Neck is clear without evidence of cervical or supraclavicular adenopathy. Lungs are clear to A&P. Cardiac examination is essentially unremarkable with regular rate and rhythm without murmur rub or thrill. Abdomen is benign with no organomegaly or masses noted. Motor sensory and DTR levels are equal and symmetric in the upper and lower extremities. Cranial nerves II through XII are grossly intact. Proprioception is intact. No peripheral adenopathy or edema is identified. No motor or sensory levels are noted. Crude visual fields are within normal range.   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  PLAN: Case was presented at our tumor conference  I discussed the case with thoracic surgery. Patient would not be a candidate for surgical resection based on significant comorbidities including severe COPD asthma hypertension EtOH use. Patient also adamantly refuses any surgical intervention. I believe the lesion may be amenable to SB RT. I will plan on delivering 5000 cGy in 5 fractions. Risks and benefits of treatment including possible dysphasia, fatigue, alteration of blood counts, skin reaction all were discussed in detail with the patient and her family. I have set up and ordered CT simulation for SB RT early next week. Family and patient seem to comprehend my treatment plan well. Having a meeting with thoracic surgeon  this morning for his opinion.  I would like to take this opportunity for allowing me to participate in the care of your patient.Armstead Peaks., MD

## 2015-01-03 NOTE — Progress Notes (Signed)
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  Patient underwent initial evaluation with CT scan of the chest which revealed spiculated right lower lobe lung mass.  Patient underwent PET scan PET scan and right lower lobe lung mass was FDG positive.  No mediastinal adenopathy.  During evaluation patient developed left hip fracture.  Patient fell and broke her left hip underwent surgery on Lovenox undergoing rehabilitation therapy area patient has alsosevere osteoporosis and multiple vertebral compression fracture.  Also has history of COPD and diabetes.  Patient was referred to me for further evaluation regarding right lower lobe lung mass which was PET positive.  She denies any hemoptysis.  Dry hacking cough.  Occasional chest pain.  September, 2016 Patient is here for ongoing evaluation and treatment consideration. Patient is gradually recovering from the hip surgery.  Making good progress with ambulation.  Walking with the help of walker.  Patient is still on few days of anticoagulation therapy Patient continues to smoke but trying to put all that.  Quit smoking Biopsies positive for squamous cell carcinoma REVIEW OF SYSTEMS:   Gen. status: Patient is in wheelchair because of left hip fracture.  HEENT: Denies any headache or dizziness.  No soreness  in the mouth.  Musculoskeletal system multiple vertebral compression fracture MRI scan was done which revealed osteoporotic compression fracture.  Lungs: Patient has COPD.  Not on her neck oxygen at present time.  Chronic smoker.  No hemoptysis.  No chest pain. Cardiac: No paroxysmal nocturnal dyspnea and no coronary artery disease. GI: No nausea no vomiting or diarrhea appetite remains stable.  No rectal bleeding. Lower extremity no edema. Skin: No rash. Psychiatric system: Anxiety and depression. Patient is making good recovery from hip surgery  As per HPI. Otherwise, a complete review of systems is negatve.  PAST MEDICAL HISTORY: Past Medical History  Diagnosis Date  . Asthma   . Hypertension   . Lung mass   . COPD (chronic obstructive pulmonary disease)   . Alcohol abuse   . Mass of lower lobe of right lung 12/11/2014  . Squamous cell carcinoma lung     PAST SURGICAL HISTORY: Past Surgical History  Procedure Laterality Date  . Hip surgery Right 2013  . Femur im nail Left 12/01/2014    Procedure: INTRAMEDULLARY (IM) NAIL FEMORAL;  Surgeon: Thornton Park, MD;  Location: [facility] ORS;  Service: Orthopedics;  Laterality: Left;  . Hip pinning Left 2016  . Cesarean section      x 3  . Broken arm repair Right     FAMILY HISTORY Family History  Problem Relation Age of Onset  . Hypertension Mother   . Cirrhosis Father      ADVANCED DIRECTIVES:  Patient does not have any living will or healthcare power of attorney.  Information was given .  Available resources had been discussed.  We will follow-up on subsequent appointments regarding this issue  HEALTH MAINTENANCE: Social History  Substance Use Topics  . Smoking status: Current Every Day Smoker -- 0.25 packs/day for 40 years    Types: Cigarettes  . Smokeless tobacco: None     Comment: start smoking in late teens  . Alcohol Use: 12.0 oz/week    20 Cans of beer per week     Comment: pt denies use of alcohol as of 11/29/14 "6  pack a day"      Allergies  Allergen Reactions  . Penicillins Hives, Itching and Rash  . Tape Other (See Comments)    Irritation to skin.  Paper tape ok.    Current Outpatient Prescriptions  Medication Sig Dispense Refill  . albuterol (PROVENTIL HFA;VENTOLIN HFA) 108 (90 BASE) MCG/ACT inhaler Inhale 2 puffs into the lungs every 4 (four) hours as needed for wheezing or shortness of breath.    Marland Kitchen alendronate (FOSAMAX) 70 MG tablet Take 70 mg by mouth once a week. Pt takes on Tuesday.    Marland Kitchen aspirin 81 MG chewable tablet Chew 81 mg by mouth daily.    . calcium-vitamin D (OSCAL WITH D) 500-200 MG-UNIT per tablet Take 1 tablet by mouth daily with breakfast.     . carisoprodol (SOMA) 350 MG tablet Take 350 mg by mouth 3 (three) times daily as needed for muscle spasms.    Marland Kitchen diltiazem (CARDIZEM) 60 MG tablet Take 60 mg by mouth 2 (two) times daily.    Marland Kitchen enoxaparin (LOVENOX) 30 MG/0.3ML injection Inject 0.3 mLs (30 mg total) into the skin daily. 12 Syringe 0  . mirtazapine (REMERON) 7.5 MG tablet Take 7.5 mg by mouth at bedtime.    . potassium chloride (K-DUR) 10 MEQ tablet Take 10 mEq by mouth daily.    Marland Kitchen tiotropium (SPIRIVA) 18 MCG inhalation capsule Place 18 mcg into inhaler and inhale daily.    . traMADol (ULTRAM) 50 MG tablet Take 1 tablet (50 mg total) by mouth every 8 (eight) hours as needed for moderate pain. 30 tablet 0  . traZODone (DESYREL) 100 MG tablet Take 100 mg by mouth at bedtime.    Marland Kitchen HYDROcodone-acetaminophen (NORCO/VICODIN) 5-325 MG per tablet Take 1 tablet by mouth every 6 (six) hours as needed for moderate pain.     No current facility-administered medications for this visit.    OBJECTIVE: PHYSICAL EXAM: GENERAL:  Well developed, well nourished, sitting comfortably in the exam room in no acute distress. MENTAL STATUS:  Alert and oriented to person, place and time. . ENT:  Oropharynx clear without lesion.  Tongue normal. Mucous membranes moist.  RESPIRATORY:  Clear to  auscultation without rales, wheezes or rhonchi. CARDIOVASCULAR:  Regular rate and rhythm without murmur, rub or gallop.  ABDOMEN:  Soft, non-tender, with active bowel sounds, and no hepatosplenomegaly.  No masses. BACK:  No CVA tenderness.  No tenderness on percussion of the back or rib cage. SKIN:  No rashes, ulcers or lesions. EXTREMITIES: No edema, no skin discoloration or tenderness.  No palpable cords.  Left hip fracture which is healing. LYMPH NODES: No palpable cervical, supraclavicular, axillary or inguinal adenopathy  NEUROLOGICAL: Unremarkable. PSYCH:  Appropriate.  Filed Vitals:   01/02/15 1056  BP: 158/85  Pulse: 75  Temp: 95.4 F (35.2 C)     Body mass index is 17.12 kg/(m^2).    ECOG FS:2 - Symptomatic, <50% confined to bed  LAB RESULTS:  No visits with results  within 5 Day(s) from this visit. Latest known visit with results is:  Admission on 12/26/2014, Discharged on 12/26/2014  Component Date Value Ref Range Status  . SURGICAL PATHOLOGY 12/26/2014    Final                   Value:Surgical Pathology CASE: [id number] PATIENT: Javana Credit Surgical Pathology Report     SPECIMEN SUBMITTED: A. Lung, right lower lobe, biopsy  CLINICAL HISTORY: 59 year old chronic smoker with a 3.0 cm spiculated right lower lobe lung mass identified on CT.  PRE-OPERATIVE DIAGNOSIS: Right lower lobe mass  POST-OPERATIVE DIAGNOSIS: Same as pre-op     DIAGNOSIS: A. LUNG MASS, RIGHT LOWER LOBE; ENDOBRONCHIAL BIOPSY: - SQUAMOUS CELL CARCINOMA.  Comment: These findings were communicated to Drs. Kasa and Freddie Nghiem on 12/27/2014. Please see [id number] for concurrent FNA of right lower lobe mass.  GROSS DESCRIPTION:  A. Labeled: right lower lobe biopsy Tissue Fragment(s): multiple Measurement: aggregate, 1.1 x 0.2 x 0.1 cm Comment: wispy pink fragments, wrapped in lens paper, and into a mesh bag 4 Diff Quik slides prepared Entirely submitted in cassette(s):  1   Final Diagnosis performed by Quay Burow, MD.  Electronically signed 12/27/2014 10:51:50AM                             The electronic signature indicates that the named Attending Pathologist has evaluated the specimen  Technical component performed at [facility], [address] [place], [place] [address] Lab: [phone] Dir: Darrick Penna. Evette Doffing, MD  Professional component performed at [facility], [facility], [place], [place], [place] [address] Lab: [phone] Dir: Dellia Nims. Reuel Derby, MD    . CYTOLOGY - NON GYN 12/26/2014    Final                   Value:Cytology - Non PAP CASE: [id number] PATIENT: Shirlette Benegas Non-Gyn Cytology Report     SPECIMEN SUBMITTED: A. Lung, right lower lobe, FNA  CLINICAL HISTORY: 59 year old chronic smoker with a 3.0 cm spiculated right lower lobe lung mass identified on CT.  PRE-OPERATIVE DIAGNOSIS: Right lower lobe mass  POST-OPERATIVE DIAGNOSIS: Same as pre-op     DIAGNOSIS: A. LUNG MASS, RIGHT LOWER LOBE; FNA: - SQUAMOUS CELL CARCINOMA.  GROSS DESCRIPTION:  A. Site: right lower lobe Procedure: bronchoscopy Cytotechnologist: Micheline Rough and An Troung Specimen(s) collected: 1 Diff Quik stained slides 1 Pap stained slides Specimen labeled RLL FNA:      Description: pink CytoLyt solution with 0.3 x 0.2 x 0.1 cm aggregate of pink fragments      Submitted for:           ThinPrep           Cell block(s): 1  A forceps biopsy was obtained and will be reported in a separate ARS report. Final Diagnosis performed by Quay Burow, MD.  Electronically signed 12/31/18                         16 10:44:44AM    The electronic signature indicates that the named Attending Pathologist has evaluated the specimen  Technical component performed at [facility], [address], [place], [place] [address] Lab: [phone] Dir: Darrick Penna. Evette Doffing, MD  Professional component performed at [facility], [facility], [place], [place], [place] [address] Lab: [phone] Dir: Dellia Nims. Rubinas, MD       STUDIES: IMPRESSION: 1. There is  intense FDG uptake associated with the right lower lobe central mass which is worrisome for primary bronchogenic carcinoma. 2. No evidence for hypermetabolic mediastinal or hilar adenopathy and no evidence for distant metastatic disease. 3. Emphysema 4. Aortic atherosclerosis. Ct Chest Wo Contrast  12/26/2014   CLINICAL DATA:  RIGHT lung mass.  Subsequent evaluation  EXAM: CT CHEST WITHOUT CONTRAST  TECHNIQUE: Multidetector CT imaging of the chest was performed following the standard protocol without IV contrast.  COMPARISON:  CT 11/04/2014  FINDINGS: Mediastinum/Nodes: No axillary or supraclavicular lymphadenopathy. No mediastinal hilar adenopathy. No pericardial fluid.  Lungs/Pleura: RIGHT lobe mass measures 3.0 cm increased from 2.7 cm on prior. There is mild postobstructive pneumonitis in the RIGHT lower lobe. No new pulmonary nodularity. Centrilobular emphysema in the upper lobes.  Upper abdomen: Adrenal glands are normal. No liver lesion evident on this noncontrast CT.  Musculoskeletal: No aggressive osseous lesion. There is compression deformities of lower thoracic spine not changed comparison compression fracture in the upper thoracic spine is also unchanged.  IMPRESSION: 1. Interval enlargement of RIGHT lower lobe mass. 2. Stable compression fractures of the thoracic spine.   Electronically Signed   By: Suzy Bouchard M.D.   On: 12/26/2014 16:32    ASSESSMENT: . Right lower lobe lung mass Bronchoscopies positive for squamous cell carcinoma of lung T1 N1 M0 tumor is staged by PET scan criteria PET scan and CT scan has been reviewed independently and reviewed with the patient 2, COPD 3.  Multiple vertebral compression fracture.  Osteoporosis patient has been started on Fosamax 4.  Right hip fracture status post internal fixation on November 29, 2014 Patient is on Lovenox undergoing rehabilitation therapy at home Making progress in terms of ambulation 5.  Diabetes 6.  Hyponatremia has been noticed at the time of admission in the hospital in August 5 which has been later on corrected PLAN:   Case was discussed in tumor conference.  I discussed situation with Dr. Torrie Mayers and Dr. Faith Rogue and family. Pulmonary functions were done and this shows a reasonable FEV1 The patient would be evaluated by thoracic surgeon and radiation oncologist forward definite acute treatment plan Total duration of visit was 30 minutes.  50% or more time was spent in counseling patient and family regarding prognosis and options of treatment and available resources Our lung cancer navigator Mr. Dara Lords will let me know about pulmonary evaluation   Patient expressed understanding and was in agreement with this plan. She also understands that She can call clinic at any time with any questions, concerns, or complaints.    No matching staging information was found for the patient.  Forest Gleason, MD   01/03/2015 11:38 AM

## 2015-01-07 ENCOUNTER — Ambulatory Visit
Admission: RE | Admit: 2015-01-07 | Discharge: 2015-01-07 | Disposition: A | Discharge status: Home / Self Care | Payer: Medicare Other | Source: Ambulatory Visit | Attending: Radiation Oncology | Admitting: Radiation Oncology

## 2015-01-07 DIAGNOSIS — J449 Chronic obstructive pulmonary disease, unspecified: Secondary | ICD-10-CM | POA: Diagnosis not present

## 2015-01-07 DIAGNOSIS — F17210 Nicotine dependence, cigarettes, uncomplicated: Secondary | ICD-10-CM | POA: Diagnosis not present

## 2015-01-07 DIAGNOSIS — Z510 Encounter for antineoplastic radiation therapy: Secondary | ICD-10-CM | POA: Diagnosis present

## 2015-01-07 DIAGNOSIS — C3431 Malignant neoplasm of lower lobe, right bronchus or lung: Secondary | ICD-10-CM | POA: Diagnosis not present

## 2015-01-07 DIAGNOSIS — I10 Essential (primary) hypertension: Secondary | ICD-10-CM | POA: Diagnosis not present

## 2015-01-13 DIAGNOSIS — Z510 Encounter for antineoplastic radiation therapy: Secondary | ICD-10-CM | POA: Diagnosis not present

## 2015-01-16 ENCOUNTER — Ambulatory Visit
Admission: RE | Admit: 2015-01-16 | Discharge: 2015-01-16 | Disposition: A | Discharge status: Home / Self Care | Payer: Medicare Other | Source: Ambulatory Visit | Attending: Radiation Oncology | Admitting: Radiation Oncology

## 2015-01-16 DIAGNOSIS — Z510 Encounter for antineoplastic radiation therapy: Secondary | ICD-10-CM | POA: Diagnosis not present

## 2015-01-20 ENCOUNTER — Ambulatory Visit: Payer: Medicare Other | Admitting: Radiation Oncology

## 2015-01-21 ENCOUNTER — Other Ambulatory Visit: Payer: Self-pay | Admitting: *Deleted

## 2015-01-21 ENCOUNTER — Ambulatory Visit
Admission: RE | Admit: 2015-01-21 | Discharge: 2015-01-21 | Disposition: A | Discharge status: Home / Self Care | Payer: Medicare Other | Source: Ambulatory Visit | Attending: Radiation Oncology | Admitting: Radiation Oncology

## 2015-01-21 DIAGNOSIS — Z510 Encounter for antineoplastic radiation therapy: Secondary | ICD-10-CM | POA: Diagnosis not present

## 2015-01-21 MED ORDER — HYDROCODONE-ACETAMINOPHEN 5-325 MG PO TABS: 1.0000 | ORAL_TABLET | Freq: Four times a day (QID) | ORAL | Status: DC | PRN

## 2015-01-22 ENCOUNTER — Ambulatory Visit: Payer: Medicare Other | Admitting: Radiation Oncology

## 2015-01-23 ENCOUNTER — Ambulatory Visit: Payer: Medicare Other | Admitting: Radiation Oncology

## 2015-01-23 ENCOUNTER — Ambulatory Visit
Admission: RE | Admit: 2015-01-23 | Discharge: 2015-01-23 | Disposition: A | Discharge status: Home / Self Care | Payer: Medicare Other | Source: Ambulatory Visit | Attending: Radiation Oncology | Admitting: Radiation Oncology

## 2015-01-27 ENCOUNTER — Ambulatory Visit: Payer: Medicare Other | Admitting: Radiation Oncology

## 2015-01-28 ENCOUNTER — Ambulatory Visit
Admission: RE | Admit: 2015-01-28 | Discharge: 2015-01-28 | Disposition: A | Discharge status: Home / Self Care | Payer: Medicare Other | Source: Ambulatory Visit | Attending: Radiation Oncology | Admitting: Radiation Oncology

## 2015-01-28 DIAGNOSIS — I10 Essential (primary) hypertension: Secondary | ICD-10-CM | POA: Diagnosis not present

## 2015-01-28 DIAGNOSIS — C3431 Malignant neoplasm of lower lobe, right bronchus or lung: Secondary | ICD-10-CM | POA: Diagnosis not present

## 2015-01-28 DIAGNOSIS — Z510 Encounter for antineoplastic radiation therapy: Secondary | ICD-10-CM | POA: Diagnosis not present

## 2015-01-28 DIAGNOSIS — F17210 Nicotine dependence, cigarettes, uncomplicated: Secondary | ICD-10-CM | POA: Diagnosis not present

## 2015-01-28 DIAGNOSIS — J449 Chronic obstructive pulmonary disease, unspecified: Secondary | ICD-10-CM | POA: Diagnosis not present

## 2015-01-29 ENCOUNTER — Ambulatory Visit: Payer: Medicare Other | Admitting: Radiation Oncology

## 2015-01-30 ENCOUNTER — Ambulatory Visit
Admission: RE | Admit: 2015-01-30 | Discharge: 2015-01-30 | Disposition: A | Discharge status: Home / Self Care | Payer: Medicare Other | Source: Ambulatory Visit | Attending: Radiation Oncology | Admitting: Radiation Oncology

## 2015-01-30 ENCOUNTER — Ambulatory Visit: Payer: Medicare Other | Admitting: Radiation Oncology

## 2015-01-30 DIAGNOSIS — Z510 Encounter for antineoplastic radiation therapy: Secondary | ICD-10-CM | POA: Diagnosis not present

## 2015-02-04 ENCOUNTER — Ambulatory Visit
Admission: RE | Admit: 2015-02-04 | Discharge: 2015-02-04 | Disposition: A | Discharge status: Home / Self Care | Payer: Medicare Other | Source: Ambulatory Visit | Attending: Radiation Oncology | Admitting: Radiation Oncology

## 2015-02-04 DIAGNOSIS — Z510 Encounter for antineoplastic radiation therapy: Secondary | ICD-10-CM | POA: Diagnosis not present

## 2015-03-06 ENCOUNTER — Ambulatory Visit: Payer: Medicare Other | Admitting: Radiation Oncology

## 2015-03-07 ENCOUNTER — Encounter: Payer: Self-pay | Admitting: Radiation Oncology

## 2015-03-07 ENCOUNTER — Other Ambulatory Visit: Payer: Self-pay | Admitting: *Deleted

## 2015-03-07 ENCOUNTER — Ambulatory Visit
Admission: RE | Admit: 2015-03-07 | Discharge: 2015-03-07 | Disposition: A | Discharge status: Home / Self Care | Payer: Medicare Other | Source: Ambulatory Visit | Attending: Radiation Oncology | Admitting: Radiation Oncology

## 2015-03-07 VITALS — BP 160/91 | HR 96 | Temp 96.8°F | Resp 22 | Wt <= 1120 oz

## 2015-03-07 DIAGNOSIS — IMO0002 Reserved for concepts with insufficient information to code with codable children: Secondary | ICD-10-CM

## 2015-03-07 DIAGNOSIS — Z85118 Personal history of other malignant neoplasm of bronchus and lung: Secondary | ICD-10-CM

## 2015-03-07 NOTE — Progress Notes (Signed)
Radiation Oncology Follow up Note  Name: Rachael Palmer   Date:   03/07/2015 MRN:  [id number] DOB: December 25, 1955    This 59 y.o. female presents to the clinic today for follow-up for SB RT of right lung T2 squamous cell carcinoma now 1 month out.  REFERRING PROVIDER: Kirk Ruths, MD  HPI: Patient is a 59 year old female now 1 month out having completed SB RT to her right lung for a T2 N0 squamous cell carcinoma. She is seen today in routine follow-up and is doing well. She has a slight nonproductive cough no hemoptysis. Her breathing status is unchanged. She's having no dysphagia..  COMPLICATIONS OF TREATMENT: none  FOLLOW UP COMPLIANCE: keeps appointments   PHYSICAL EXAM:  BP 160/91 mmHg  Pulse 96  Temp(Src) 96.8 F (36 C)  Resp 22  Wt 69 lb 7.1 oz (31.5 kg) Well-developed well-nourished patient in NAD. HEENT reveals PERLA, EOMI, discs not visualized.  Oral cavity is clear. No oral mucosal lesions are identified. Neck is clear without evidence of cervical or supraclavicular adenopathy. Lungs are clear to A&P. Cardiac examination is essentially unremarkable with regular rate and rhythm without murmur rub or thrill. Abdomen is benign with no organomegaly or masses noted. Motor sensory and DTR levels are equal and symmetric in the upper and lower extremities. Cranial nerves II through XII are grossly intact. Proprioception is intact. No peripheral adenopathy or edema is identified. No motor or sensory levels are noted. Crude visual fields are within normal range.  RADIOLOGY RESULTS: CT scan of the chest has been ordered into a half months  PLAN: At the present time she is recovering well from SB RT radiation for lung cancer. She's [place] had no side effects except for mild nonproductive cough. I've asked to see her back in 3 months and we have CT scan shortly before that visit with contrast to evaluate her chest. Patient knows to call sooner with any concerns. She continues to do  extremely well.  I would like to take this opportunity for allowing me to participate in the care of your patient.Armstead Peaks., MD

## 2015-07-08 ENCOUNTER — Ambulatory Visit: Admission: RE | Admit: 2015-07-08 | Payer: Medicare Other | Source: Ambulatory Visit

## 2015-07-25 ENCOUNTER — Ambulatory Visit: Payer: Medicare Other | Admitting: Radiation Oncology

## 2015-07-25 ENCOUNTER — Inpatient Hospital Stay: Admission: RE | Admit: 2015-07-25 | Payer: Medicare Other | Source: Ambulatory Visit | Admitting: Radiation Oncology

## 2015-08-15 ENCOUNTER — Other Ambulatory Visit: Payer: Self-pay | Admitting: *Deleted

## 2015-08-15 ENCOUNTER — Encounter: Payer: Self-pay | Admitting: Radiation Oncology

## 2015-08-15 ENCOUNTER — Ambulatory Visit
Admission: RE | Admit: 2015-08-15 | Discharge: 2015-08-15 | Disposition: A | Discharge status: Home / Self Care | Payer: Medicare Other | Source: Ambulatory Visit | Attending: Radiation Oncology | Admitting: Radiation Oncology

## 2015-08-15 VITALS — BP 137/86 | HR 111 | Temp 97.8°F | Wt 72.6 lb

## 2015-08-15 DIAGNOSIS — C3491 Malignant neoplasm of unspecified part of right bronchus or lung: Secondary | ICD-10-CM

## 2015-08-15 NOTE — Progress Notes (Signed)
Radiation Oncology Follow up Note  Name: Rachael Palmer   Date:   08/15/2015 MRN:  [id number] DOB: [birth year]    This 60 y.o. female presents to the clinic today for for [place] cell carcinoma of the right lung status post SB RT now out 5 months.  REFERRING PROVIDER: Kirk Ruths, MD  HPI: Patient is a 60 year old female now out 5 months having completed SB RT to her right lung for a T2 squamous cell carcinoma. She seen today in routine follow-up and is doing well she missed her CT scan of her chest and we have rescheduled that. She specifically denies cough dysphasia fatigue. Her weight is stable and she is feeling well..  COMPLICATIONS OF TREATMENT: none  FOLLOW UP COMPLIANCE: keeps appointments   PHYSICAL EXAM:  BP 137/86 mmHg  Pulse 111  Temp(Src) 97.8 F (36.6 C)  Wt 72 lb 10.3 oz (32.95 kg) Small frail female in NAD. Well-developed well-nourished patient in NAD. HEENT reveals PERLA, EOMI, discs not visualized.  Oral cavity is clear. No oral mucosal lesions are identified. Neck is clear without evidence of cervical or supraclavicular adenopathy. Lungs are clear to A&P. Cardiac examination is essentially unremarkable with regular rate and rhythm without murmur rub or thrill. Abdomen is benign with no organomegaly or masses noted. Motor sensory and DTR levels are equal and symmetric in the upper and lower extremities. Cranial nerves II through XII are grossly intact. Proprioception is intact. No peripheral adenopathy or edema is identified. No motor or sensory levels are noted. Crude visual fields are within normal range.  RADIOLOGY RESULTS: CT scan with contrast of the chest has been ordered and rescheduled  PLAN: Present time clinically she is doing well. I anticipate reviewing her CT scan when it becomes available. Otherwise she is doing fine with no symptoms. I will see her back in 6 months for follow-up. Will make further recommendations once I review her CT scan. Patient  knows to call sooner.  I would like to take this opportunity for allowing me to participate in the care of your patient.Armstead Peaks., MD

## 2015-09-19 ENCOUNTER — Ambulatory Visit
Admission: RE | Admit: 2015-09-19 | Discharge: 2015-09-19 | Disposition: A | Discharge status: Home / Self Care | Payer: Medicare Other | Source: Ambulatory Visit | Attending: Radiation Oncology | Admitting: Radiation Oncology

## 2015-09-19 DIAGNOSIS — J439 Emphysema, unspecified: Secondary | ICD-10-CM | POA: Diagnosis not present

## 2015-09-19 DIAGNOSIS — Z923 Personal history of irradiation: Secondary | ICD-10-CM | POA: Insufficient documentation

## 2015-09-19 DIAGNOSIS — Y842 Radiological procedure and radiotherapy as the cause of abnormal reaction of the patient, or of later complication, without mention of misadventure at the time of the procedure: Secondary | ICD-10-CM | POA: Insufficient documentation

## 2015-09-19 DIAGNOSIS — C3491 Malignant neoplasm of unspecified part of right bronchus or lung: Secondary | ICD-10-CM | POA: Insufficient documentation

## 2015-09-19 DIAGNOSIS — M4855XA Collapsed vertebra, not elsewhere classified, thoracolumbar region, initial encounter for fracture: Secondary | ICD-10-CM | POA: Diagnosis not present

## 2015-09-19 DIAGNOSIS — R911 Solitary pulmonary nodule: Secondary | ICD-10-CM | POA: Diagnosis not present

## 2015-09-19 LAB — POCT I-STAT CREATININE: Creatinine, Ser: 0.6 mg/dL (ref 0.44–1.00)

## 2015-09-19 MED ORDER — IOPAMIDOL (ISOVUE-300) INJECTION 61%: 75.0000 mL | Freq: Once | INTRAVENOUS | Status: DC | PRN

## 2015-09-19 MED ORDER — IOPAMIDOL (ISOVUE-300) INJECTION 61%
50.0000 mL | Freq: Once | INTRAVENOUS | Status: AC | PRN
  Administered 2015-09-19: 50 mL via INTRAVENOUS

## 2016-02-06 ENCOUNTER — Encounter: Payer: Self-pay | Admitting: *Deleted

## 2016-02-27 ENCOUNTER — Ambulatory Visit: Payer: Medicare Other | Admitting: Radiation Oncology

## 2016-03-11 ENCOUNTER — Encounter: Payer: Self-pay | Admitting: Radiation Oncology

## 2016-03-11 ENCOUNTER — Other Ambulatory Visit: Payer: Self-pay | Admitting: *Deleted

## 2016-03-11 ENCOUNTER — Ambulatory Visit
Admission: RE | Admit: 2016-03-11 | Discharge: 2016-03-11 | Disposition: A | Discharge status: Home / Self Care | Payer: Medicare Other | Source: Ambulatory Visit | Attending: Radiation Oncology | Admitting: Radiation Oncology

## 2016-03-11 VITALS — BP 134/78 | HR 110 | Temp 97.6°F | Resp 22 | Ht <= 58 in | Wt 75.4 lb

## 2016-03-11 DIAGNOSIS — C3431 Malignant neoplasm of lower lobe, right bronchus or lung: Secondary | ICD-10-CM | POA: Diagnosis present

## 2016-03-11 DIAGNOSIS — C3491 Malignant neoplasm of unspecified part of right bronchus or lung: Secondary | ICD-10-CM

## 2016-03-11 DIAGNOSIS — Z923 Personal history of irradiation: Secondary | ICD-10-CM | POA: Insufficient documentation

## 2016-03-11 NOTE — Progress Notes (Signed)
Radiation Oncology Follow up Note  Name: Rachael Palmer   Date:   03/11/2016 MRN:  [id number] DOB: 02-Apr-1956    This 60 y.o. female presents to the clinic today for over 1 year follow-up for SB RT to the right lung.  REFERRING PROVIDER: Kirk Ruths, MD  HPI: Patient is a 60 year old female now out over a year having completed SB RT to the right lung for squamous cell carcinoma. She had a CT scan back in May showing. Significantly decreased size of the central right lower lobe pulmonary nodule with radiation change. She specifically denies cough hemoptysis or chest tightness. Her pulmonary status has remained stable. She is in no pain.  COMPLICATIONS OF TREATMENT: none  FOLLOW UP COMPLIANCE: keeps appointments   PHYSICAL EXAM:  BP 134/78   Pulse (!) 110   Temp 97.6 F (36.4 C)   Resp (!) 22   Ht '4\' 6"'$  (1.372 m)   Wt 75 lb 6.4 oz (34.2 kg)   BMI 18.18 kg/m  Well-developed well-nourished patient in NAD. HEENT reveals PERLA, EOMI, discs not visualized.  Oral cavity is clear. No oral mucosal lesions are identified. Neck is clear without evidence of cervical or supraclavicular adenopathy. Lungs are clear to A&P. Cardiac examination is essentially unremarkable with regular rate and rhythm without murmur rub or thrill. Abdomen is benign with no organomegaly or masses noted. Motor sensory and DTR levels are equal and symmetric in the upper and lower extremities. Cranial nerves II through XII are grossly intact. Proprioception is intact. No peripheral adenopathy or edema is identified. No motor or sensory levels are noted. Crude visual fields are within normal range.  RADIOLOGY RESULTS: CT scan from a reviewed and compatible with the above-stated findings showing excellent response to radiation therapy  PLAN: At the present time I've asked to see her back in 6 months and will obtain a CT scan shortly before that visit. I will then go to once your follow-up visits should everything be  stable. She continues to do quite well status post her treatment. Patient and caregivers know to call with any concerns.  I would like to take this opportunity to thank you for allowing me to participate in the care of your patient.Armstead Peaks., MD

## 2016-04-06 IMAGING — CT CT HIP*L* W/O CM
2 of 3 series · 17 of 46 positions shown, 19 images · non-contrast
Comparison: Current left hip radiograph showing a left proximal
femur fracture.

EXAM:
CT OF THE LEFT HIP WITHOUT CONTRAST
TECHNIQUE: Multidetector CT imaging of the left hip was performed according to
the standard protocol. Multiplanar CT image reconstructions were
also generated.

[Series 3: hip soft tissue · axial · 0.42mm/px · z∈[+732,+866]mm · 14 of 77 slices shown, 16 images]
[im 5/77  soft-tissue]
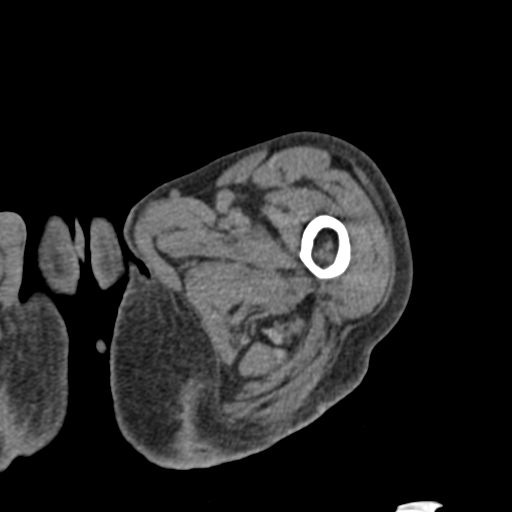
[im 5/77  bone]
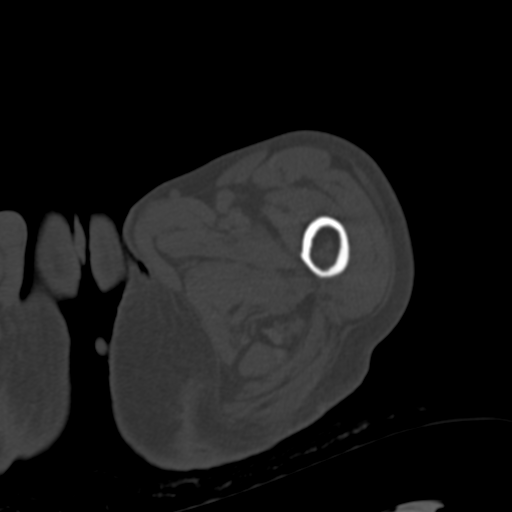
[im 10/77  soft-tissue]
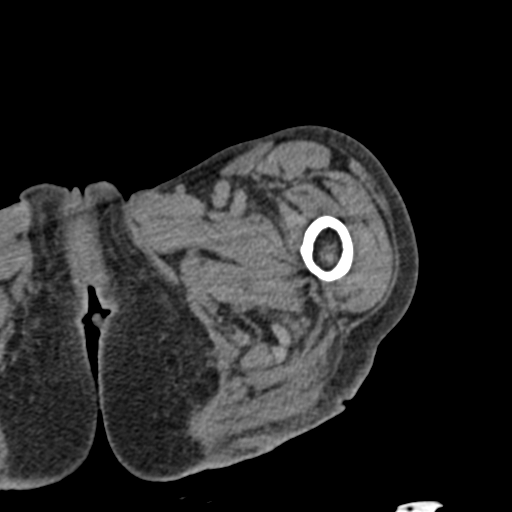
[im 15/77  soft-tissue]
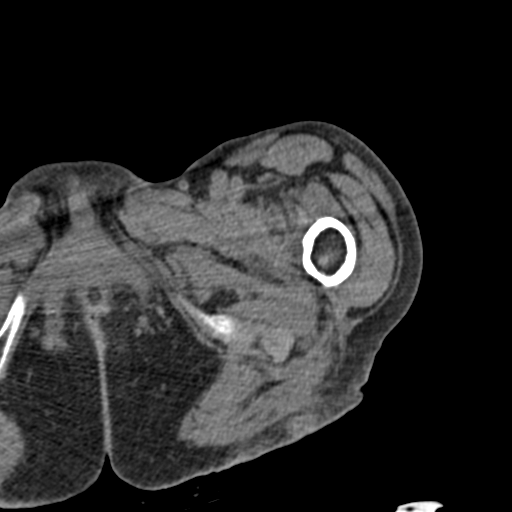
[im 20/77  soft-tissue]
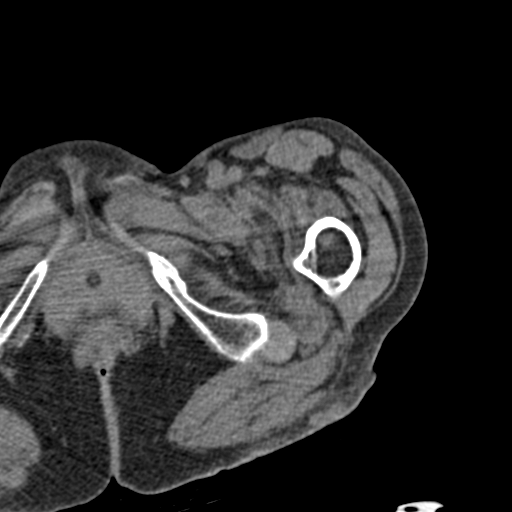
[im 25/77  soft-tissue]
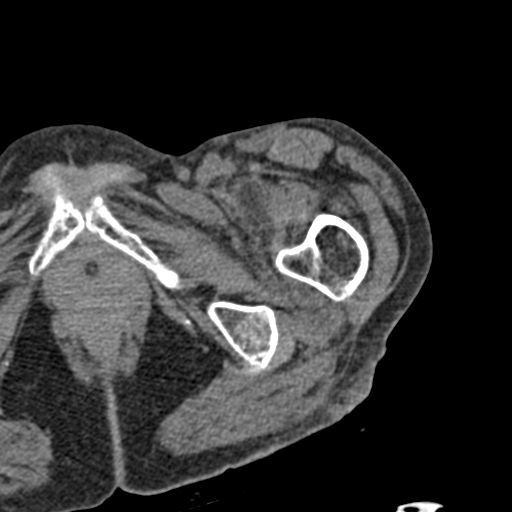
[im 30/77  soft-tissue]
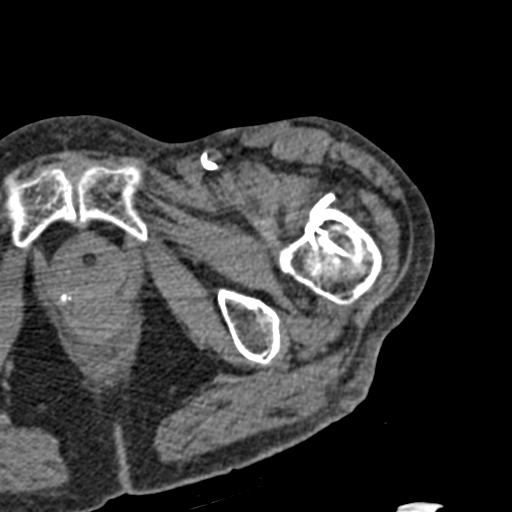
[im 35/77  soft-tissue]
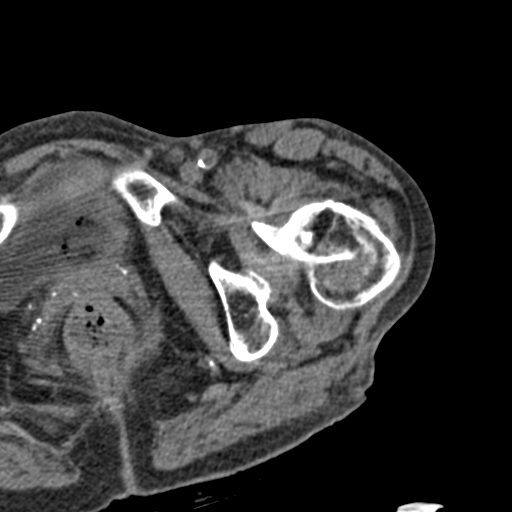
[im 42/77  soft-tissue]
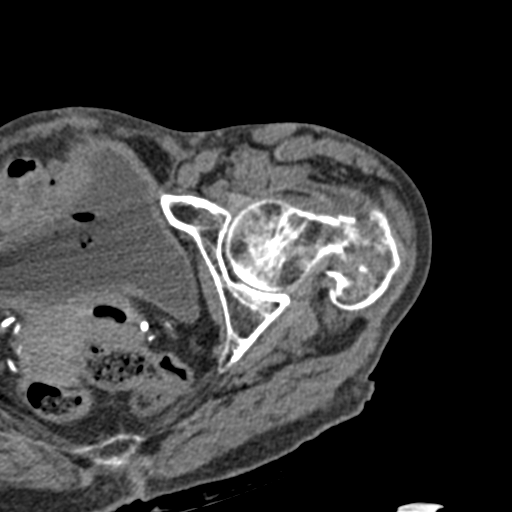
[im 47/77  soft-tissue]
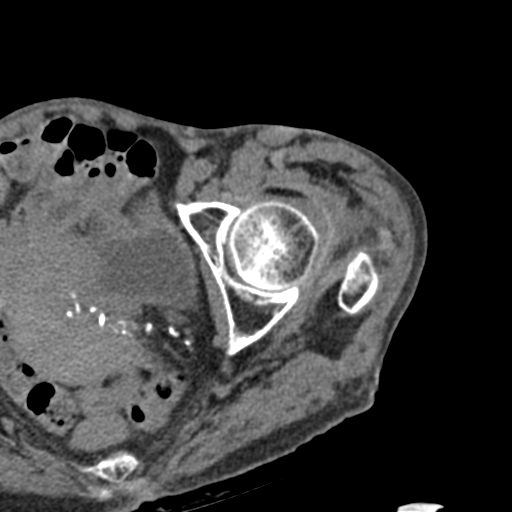
[im 47/77  bone]
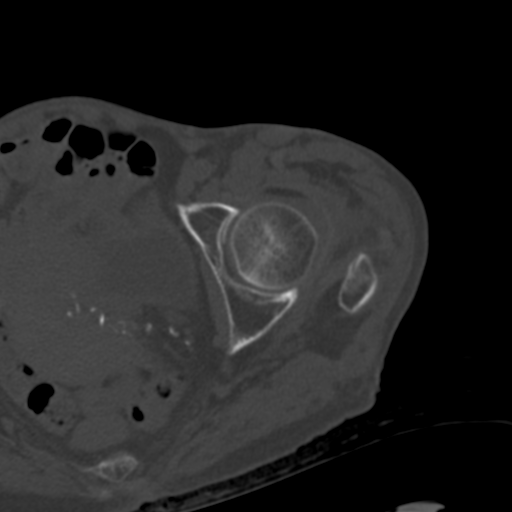
[im 52/77  soft-tissue]
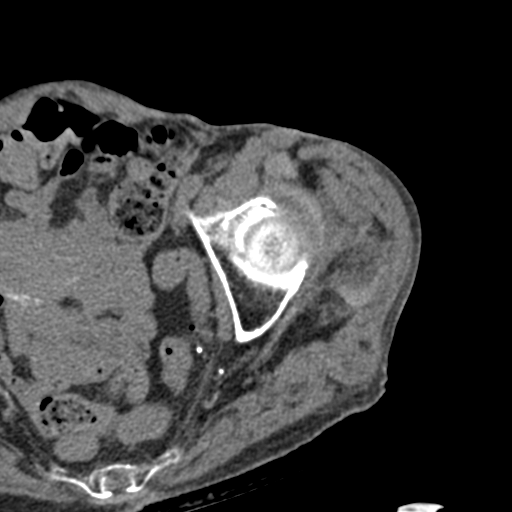
[im 57/77  soft-tissue]
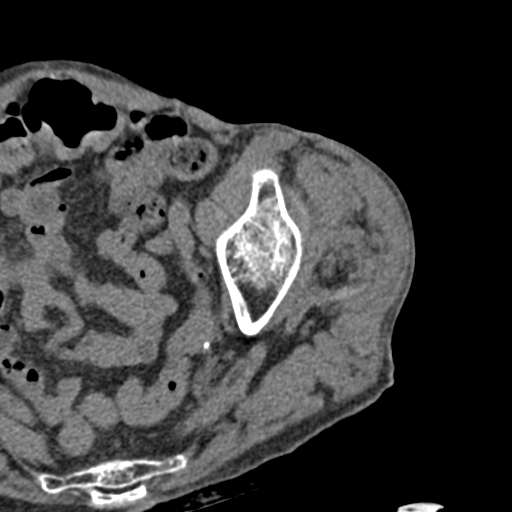
[im 62/77  soft-tissue]
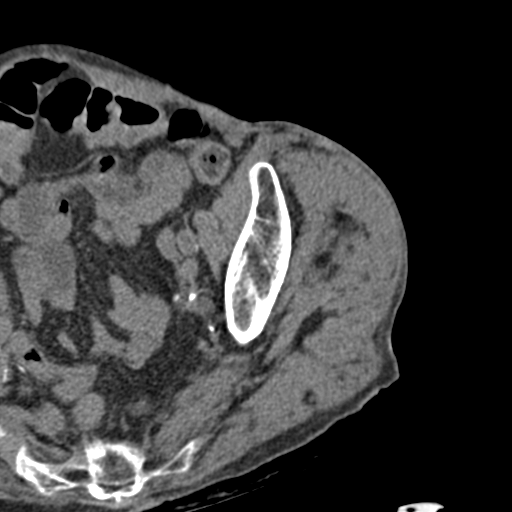
[im 67/77  soft-tissue]
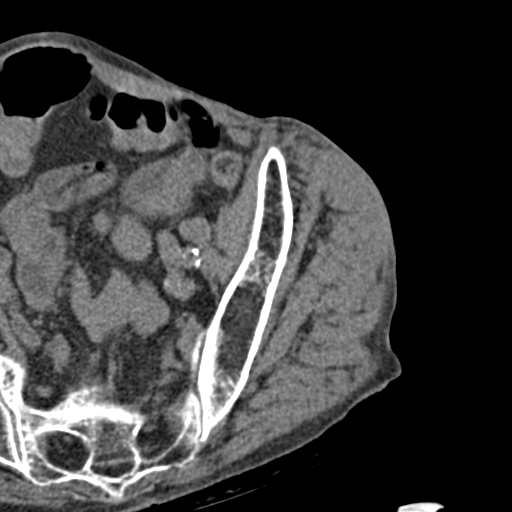
[im 72/77  soft-tissue]
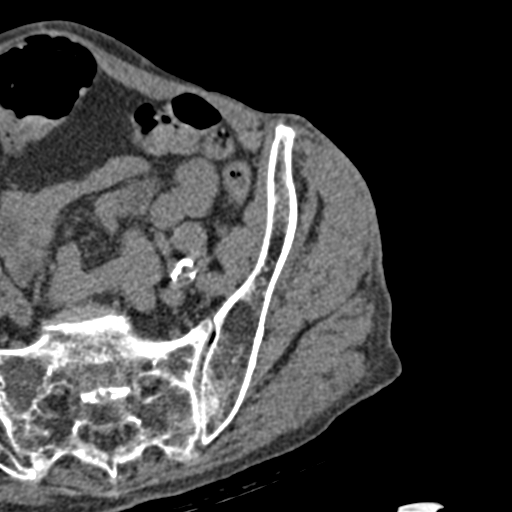

[Series 6: hip soft tissue cor · coronal · 0.33mm/px · 3 of 92 slices shown]
[im 31/92  soft-tissue]
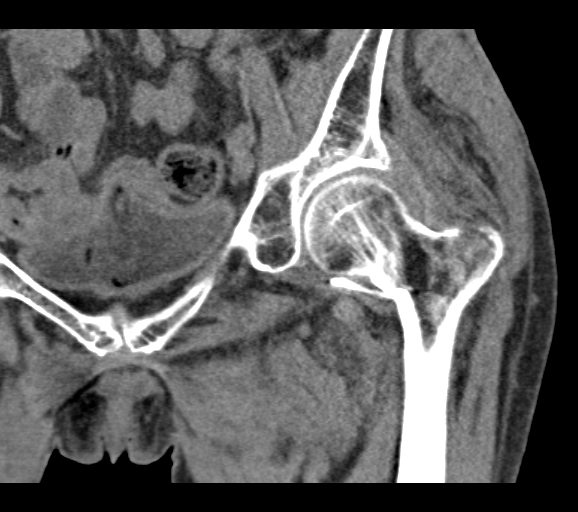
[im 41/92  soft-tissue]
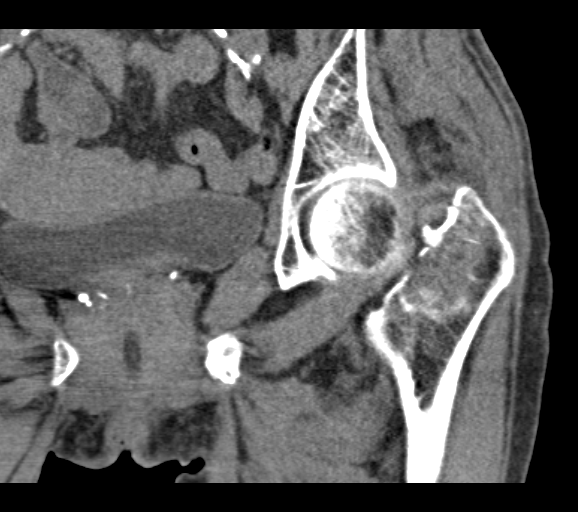
[im 51/92  soft-tissue]
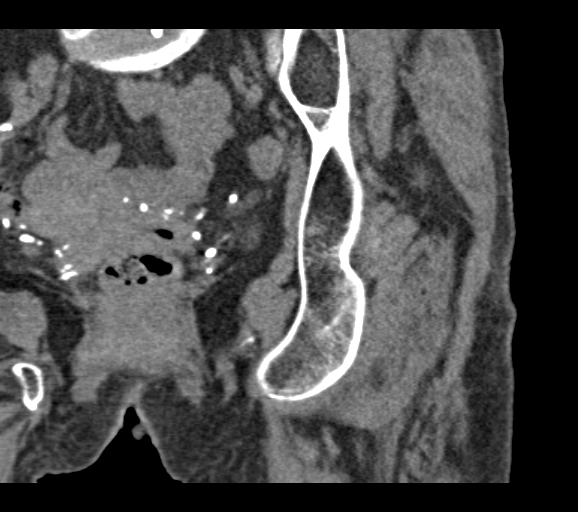

[17 of 46 positions shown; findings below may reference images not displayed]

FINDINGS: As noted on standard radiographs, there is a displaced comminuted
intertrochanteric fracture of the proximal femur. Primary fracture
line extends from the medial base of the femoral neck through the
greater trochanter. There are additional fracture fragments from the
lesser and greater trochanters. Major fracture fragments displaced,
with the base of the femoral neck displacing lateral to the shaft
component by 15 mm. Fracture displacement is also anterior measuring
approximate 12 mm. This leads to mild apex anterior angulation.
There is also mild varus angulation. The major fracture components
are impacted, again by approximate 1.5 cm.

There are no other fractures. The left hip joint is normally
aligned.

There is a left hip joint effusion consistent with hemarthrosis,
small to moderate in size.

Surrounding muscular structures are unremarkable.

Within the pelvis, Foley catheter partly decompresses the bladder.
There are vascular calcifications. No masses or adenopathy. No
ascites or abnormal fluid collections.
IMPRESSION: 1. Mildly displaced, impacted and comminuted intratrochanteric
fracture of left proximal femur as detailed above.

## 2016-04-08 IMAGING — CR DG CHEST 1V
1 series · 1 of 1 positions shown · non-contrast
Comparison: 11/29/2014

CLINICAL DATA: Shortness of breath history of asthma and COPD,
patient smokes

EXAM:
CHEST  1 VIEW

[ap]
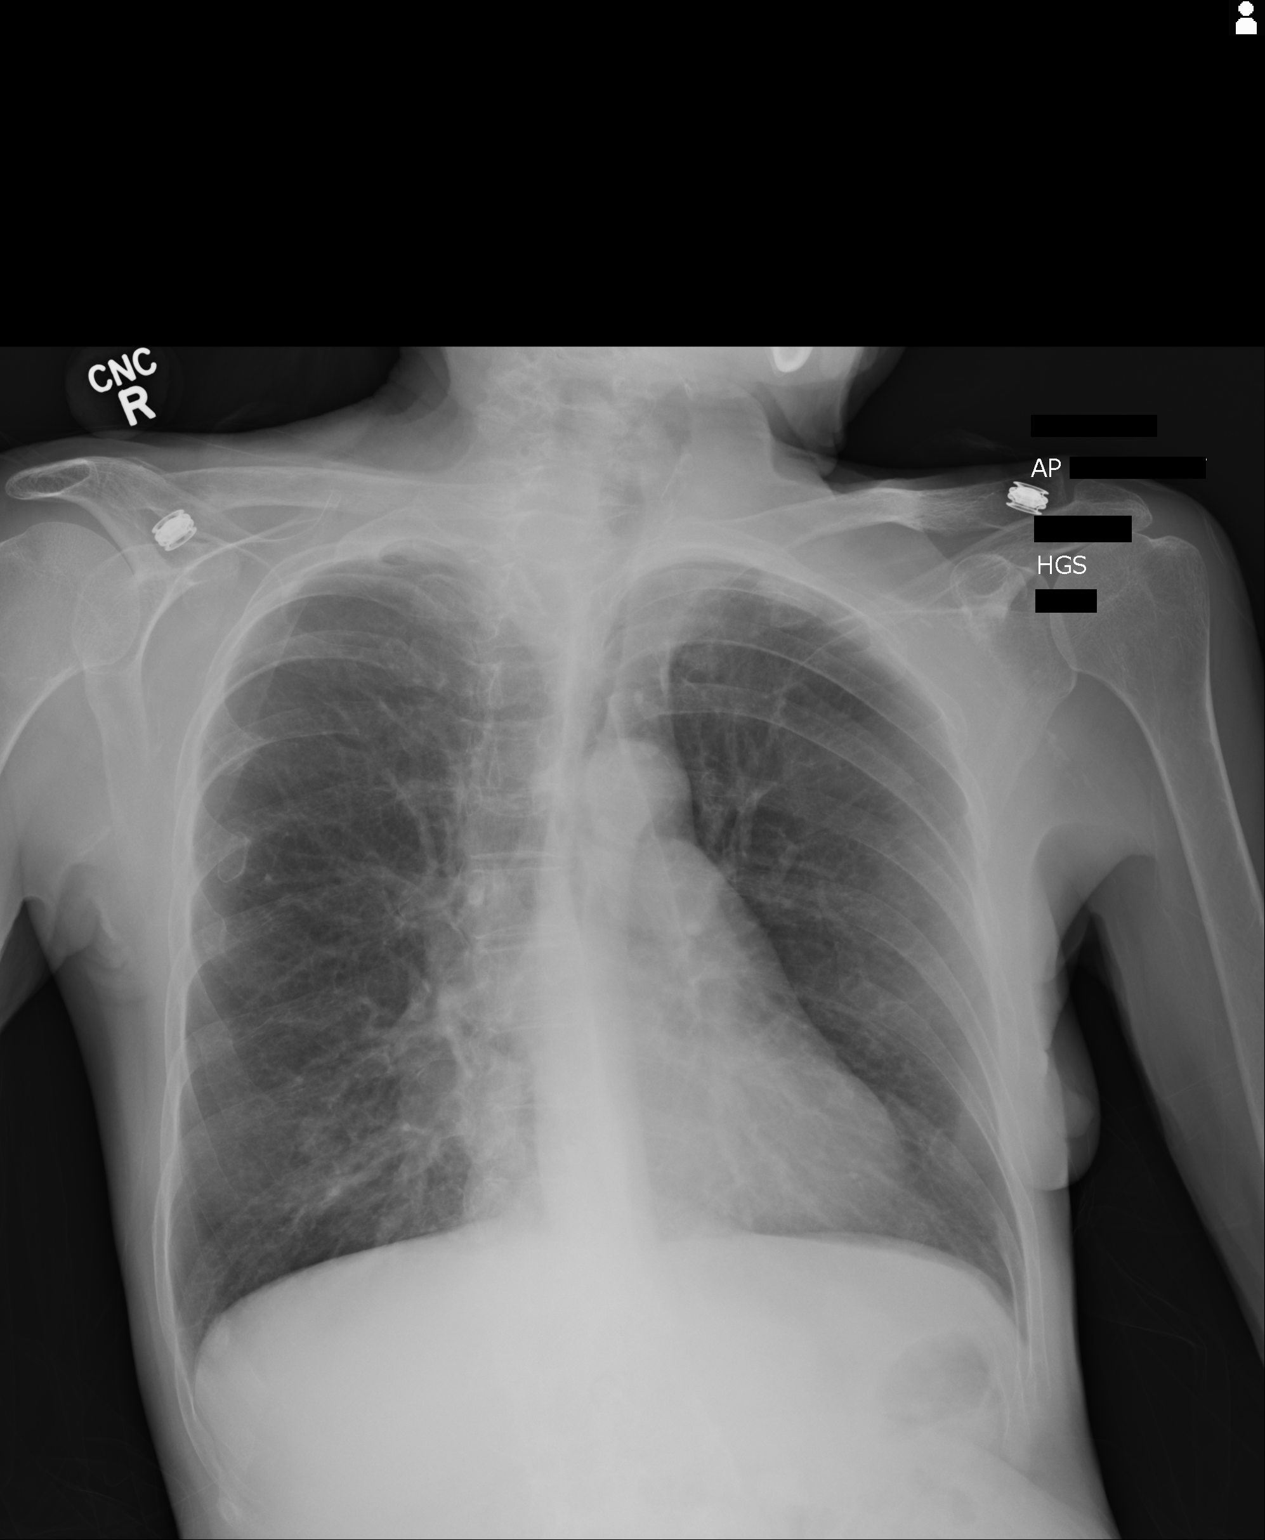

[1 of 1 positions shown; findings below may reference images not displayed]

FINDINGS: Heart size and vascular pattern normal. Hyperinflation suggests
COPD. No consolidation effusion or pneumothorax. Known infrahilar
right lung mass stable.
IMPRESSION: COPD.  Known right lung mass stable.

## 2016-08-27 ENCOUNTER — Encounter: Payer: Self-pay | Admitting: *Deleted

## 2016-09-15 ENCOUNTER — Ambulatory Visit: Admission: RE | Admit: 2016-09-15 | Payer: Medicare Other | Source: Ambulatory Visit

## 2016-09-15 ENCOUNTER — Ambulatory Visit
Admission: RE | Admit: 2016-09-15 | Discharge: 2016-09-15 | Disposition: A | Discharge status: Home / Self Care | Payer: Medicare Other | Source: Ambulatory Visit | Attending: Radiation Oncology | Admitting: Radiation Oncology

## 2016-09-15 DIAGNOSIS — M438X5 Other specified deforming dorsopathies, thoracolumbar region: Secondary | ICD-10-CM | POA: Insufficient documentation

## 2016-09-15 DIAGNOSIS — I700 Atherosclerosis of aorta: Secondary | ICD-10-CM | POA: Diagnosis not present

## 2016-09-15 DIAGNOSIS — C3491 Malignant neoplasm of unspecified part of right bronchus or lung: Secondary | ICD-10-CM

## 2016-09-15 LAB — POCT I-STAT CREATININE: CREATININE: 0.8 mg/dL (ref 0.44–1.00)

## 2016-09-15 MED ORDER — IOPAMIDOL (ISOVUE-300) INJECTION 61%
60.0000 mL | Freq: Once | INTRAVENOUS | Status: AC | PRN
  Administered 2016-09-15: 60 mL via INTRAVENOUS

## 2016-10-01 ENCOUNTER — Ambulatory Visit: Payer: Medicare Other | Admitting: Radiation Oncology

## 2016-10-05 ENCOUNTER — Other Ambulatory Visit: Payer: Self-pay | Admitting: *Deleted

## 2016-10-05 ENCOUNTER — Ambulatory Visit
Admission: RE | Admit: 2016-10-05 | Discharge: 2016-10-05 | Disposition: A | Discharge status: Home / Self Care | Payer: Medicare Other | Source: Ambulatory Visit | Attending: Radiation Oncology | Admitting: Radiation Oncology

## 2016-10-05 ENCOUNTER — Encounter: Payer: Self-pay | Admitting: Radiation Oncology

## 2016-10-05 VITALS — BP 163/84 | HR 111 | Temp 95.2°F | Resp 22 | Wt 71.0 lb

## 2016-10-05 DIAGNOSIS — C3491 Malignant neoplasm of unspecified part of right bronchus or lung: Secondary | ICD-10-CM

## 2016-10-05 DIAGNOSIS — F17210 Nicotine dependence, cigarettes, uncomplicated: Secondary | ICD-10-CM | POA: Diagnosis not present

## 2016-10-05 DIAGNOSIS — Z923 Personal history of irradiation: Secondary | ICD-10-CM | POA: Diagnosis not present

## 2016-10-05 NOTE — Progress Notes (Signed)
Radiation Oncology Follow up Note  Name: John Vasconcelos   Date:   10/05/2016 MRN:  [id number] DOB: July 10, 1955    This 61 y.o. female presents to the clinic today for 16 month follow-up status post SB RT to her right lung.  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 She seen today in routine follow-up and is doing well still has some borderline appetite issues although weight is stable. She specifically denies productive cough hemoptysis or chest tightness.. She recently had a CT scan of her chest which I have reviewed showing stable stable right infrahilar mass although there is some slight increased density along the right hemidiaphragm which may reflect post obstructive pneumonitis. No other abnormalities noted.  COMPLICATIONS OF TREATMENT: none  FOLLOW UP COMPLIANCE: keeps appointments   PHYSICAL EXAM:  BP (!) 163/84   Pulse (!) 111   Temp (!) 95.2 F (35.1 C)   Resp (!) 22   Wt 70 lb 15.8 oz (32.2 kg)   BMI 17.12 kg/m  Well-developed well-nourished patient in NAD. HEENT reveals PERLA, EOMI, discs not visualized.  Oral cavity is clear. No oral mucosal lesions are identified. Neck is clear without evidence of cervical or supraclavicular adenopathy. Lungs are clear to A&P. Cardiac examination is essentially unremarkable with regular rate and rhythm without murmur rub or thrill. Abdomen is benign with no organomegaly or masses noted. Motor sensory and DTR levels are equal and symmetric in the upper and lower extremities. Cranial nerves II through XII are grossly intact. Proprioception is intact. No peripheral adenopathy or edema is identified. No motor or sensory levels are noted. Crude visual fields are within normal range.  RADIOLOGY RESULTS: CT scans reviewed and compatible with the above-stated findings  PLAN: At this time I believe I should reevaluate her  with a repeat CT scan based on the increasing density in the right hemidiaphragm. So I've set up a follow-up in 6 months with a CT scan with contrast prior to that visit. If that is stable will start seeing her once year in follow-up. Otherwise I'm please were overall progress. Patient comprehends my treatment plan well. Appointments were made. Patient knows to call as well as family with any concerns.  I would like to take this opportunity to thank you for allowing me to participate in the care of your patient.Armstead Peaks., MD

## 2017-03-25 ENCOUNTER — Encounter: Payer: Self-pay | Admitting: *Deleted

## 2017-03-31 ENCOUNTER — Ambulatory Visit
Admission: RE | Admit: 2017-03-31 | Discharge: 2017-03-31 | Disposition: A | Discharge status: Home / Self Care | Payer: Medicare Other | Source: Ambulatory Visit | Attending: Radiation Oncology | Admitting: Radiation Oncology

## 2017-03-31 DIAGNOSIS — C3491 Malignant neoplasm of unspecified part of right bronchus or lung: Secondary | ICD-10-CM | POA: Insufficient documentation

## 2017-03-31 DIAGNOSIS — J439 Emphysema, unspecified: Secondary | ICD-10-CM | POA: Insufficient documentation

## 2017-03-31 DIAGNOSIS — I700 Atherosclerosis of aorta: Secondary | ICD-10-CM | POA: Diagnosis not present

## 2017-03-31 LAB — POCT I-STAT CREATININE: CREATININE: 0.7 mg/dL (ref 0.44–1.00)

## 2017-03-31 MED ORDER — IOPAMIDOL (ISOVUE-300) INJECTION 61%
50.0000 mL | Freq: Once | INTRAVENOUS | Status: AC | PRN
  Administered 2017-03-31: 50 mL via INTRAVENOUS

## 2017-04-07 ENCOUNTER — Ambulatory Visit: Payer: Medicare Other | Admitting: Radiation Oncology

## 2017-04-21 ENCOUNTER — Ambulatory Visit: Payer: Medicare Other | Admitting: Radiation Oncology

## 2017-05-11 ENCOUNTER — Ambulatory Visit
Admission: RE | Admit: 2017-05-11 | Discharge: 2017-05-11 | Disposition: A | Discharge status: Home / Self Care | Payer: Medicare Other | Source: Ambulatory Visit | Attending: Radiation Oncology | Admitting: Radiation Oncology

## 2017-05-11 ENCOUNTER — Other Ambulatory Visit: Payer: Self-pay | Admitting: *Deleted

## 2017-05-11 ENCOUNTER — Encounter: Payer: Self-pay | Admitting: Radiation Oncology

## 2017-05-11 ENCOUNTER — Other Ambulatory Visit: Payer: Self-pay

## 2017-05-11 VITALS — BP 143/89 | HR 103 | Temp 97.4°F | Resp 20 | Wt <= 1120 oz

## 2017-05-11 DIAGNOSIS — C3431 Malignant neoplasm of lower lobe, right bronchus or lung: Secondary | ICD-10-CM | POA: Diagnosis not present

## 2017-05-11 DIAGNOSIS — R918 Other nonspecific abnormal finding of lung field: Secondary | ICD-10-CM

## 2017-05-11 DIAGNOSIS — Z923 Personal history of irradiation: Secondary | ICD-10-CM | POA: Diagnosis not present

## 2017-05-11 DIAGNOSIS — F17210 Nicotine dependence, cigarettes, uncomplicated: Secondary | ICD-10-CM | POA: Diagnosis not present

## 2017-05-11 DIAGNOSIS — C3491 Malignant neoplasm of unspecified part of right bronchus or lung: Secondary | ICD-10-CM

## 2017-05-11 NOTE — Progress Notes (Signed)
Radiation Oncology Follow up Note  Name: Rachael Palmer   Date:   05/11/2017 MRN:  [id number] DOB: Feb 27, 1956    This 62 y.o. female presents to the clinic today for six-month follow-up status post SB RT to her right lower lobe for squamous cell carcinoma.  REFERRING PROVIDER: Kirk Ruths, MD  HPI: Patient is a 62 year old female now out 6 months having completed SB RT to her right lower lobe for squamous cell carcinoma. Seen today in routine follow-up she is doing well. She specifically denies cough dysphasia or any other decreased in pulmonary function. She recently had a CT scan. Which I have reviewed showing continued decrease size in the right lower lobe lesion although there is a new nonspecific less than 5 mm right upper lobe lesion.  COMPLICATIONS OF TREATMENT: none  FOLLOW UP COMPLIANCE: keeps appointments   PHYSICAL EXAM:  BP (!) 143/89   Pulse (!) 103   Temp (!) 97.4 F (36.3 C)   Resp 20   Wt 64 lb 11.3 oz (29.3 kg)   BMI 15.60 kg/m  Well-developed well-nourished patient in NAD. HEENT reveals PERLA, EOMI, discs not visualized.  Oral cavity is clear. No oral mucosal lesions are identified. Neck is clear without evidence of cervical or supraclavicular adenopathy. Lungs are clear to A&P. Cardiac examination is essentially unremarkable with regular rate and rhythm without murmur rub or thrill. Abdomen is benign with no organomegaly or masses noted. Motor sensory and DTR levels are equal and symmetric in the upper and lower extremities. Cranial nerves II through XII are grossly intact. Proprioception is intact. No peripheral adenopathy or edema is identified. No motor or sensory levels are noted. Crude visual fields are within normal range.  RADIOLOGY RESULTS: CT scan is reviewed and compared to prior studies  PLAN: At the present time she is doing well. Excellent response and right lower lobe with scarring noted. Will follow-up with her 5 mm new right upper lobe lesion  with a CT scan about 6 months and I will see her shortly thereafter. Should this progress may consider SB RT to that lesion. Patient is comfortable with my treatment plan. Follow-up appointment plus CT scan was scheduled. Patient knows to call with any concerns.  I would like to take this opportunity to thank you for allowing me to participate in the care of your patient.Rachael Filbert, MD

## 2017-08-02 ENCOUNTER — Encounter: Payer: Self-pay | Admitting: Emergency Medicine

## 2017-08-02 ENCOUNTER — Emergency Department: Payer: Medicare Other

## 2017-08-02 ENCOUNTER — Other Ambulatory Visit: Payer: Self-pay

## 2017-08-02 ENCOUNTER — Inpatient Hospital Stay
Admission: EM | Admit: 2017-08-02 | Discharge: 2017-08-05 | DRG: 542 | Disposition: A | Discharge status: Home / Self Care | Payer: Medicare Other | Attending: Internal Medicine | Admitting: Internal Medicine

## 2017-08-02 ENCOUNTER — Inpatient Hospital Stay: Payer: Medicare Other

## 2017-08-02 DIAGNOSIS — Z79899 Other long term (current) drug therapy: Secondary | ICD-10-CM

## 2017-08-02 DIAGNOSIS — T501X5A Adverse effect of loop [high-ceiling] diuretics, initial encounter: Secondary | ICD-10-CM | POA: Diagnosis present

## 2017-08-02 DIAGNOSIS — E43 Unspecified severe protein-calorie malnutrition: Secondary | ICD-10-CM | POA: Diagnosis present

## 2017-08-02 DIAGNOSIS — Z886 Allergy status to analgesic agent status: Secondary | ICD-10-CM

## 2017-08-02 DIAGNOSIS — Z7982 Long term (current) use of aspirin: Secondary | ICD-10-CM | POA: Diagnosis not present

## 2017-08-02 DIAGNOSIS — Z91048 Other nonmedicinal substance allergy status: Secondary | ICD-10-CM

## 2017-08-02 DIAGNOSIS — M8008XA Age-related osteoporosis with current pathological fracture, vertebra(e), initial encounter for fracture: Secondary | ICD-10-CM | POA: Diagnosis not present

## 2017-08-02 DIAGNOSIS — Z681 Body mass index (BMI) 19 or less, adult: Secondary | ICD-10-CM | POA: Diagnosis not present

## 2017-08-02 DIAGNOSIS — C3431 Malignant neoplasm of lower lobe, right bronchus or lung: Secondary | ICD-10-CM | POA: Diagnosis present

## 2017-08-02 DIAGNOSIS — F17210 Nicotine dependence, cigarettes, uncomplicated: Secondary | ICD-10-CM | POA: Diagnosis present

## 2017-08-02 DIAGNOSIS — E871 Hypo-osmolality and hyponatremia: Secondary | ICD-10-CM | POA: Diagnosis present

## 2017-08-02 DIAGNOSIS — Z7983 Long term (current) use of bisphosphonates: Secondary | ICD-10-CM

## 2017-08-02 DIAGNOSIS — S22000A Wedge compression fracture of unspecified thoracic vertebra, initial encounter for closed fracture: Secondary | ICD-10-CM

## 2017-08-02 DIAGNOSIS — Z880 Allergy status to penicillin: Secondary | ICD-10-CM

## 2017-08-02 DIAGNOSIS — Z79891 Long term (current) use of opiate analgesic: Secondary | ICD-10-CM

## 2017-08-02 DIAGNOSIS — R0602 Shortness of breath: Secondary | ICD-10-CM | POA: Diagnosis not present

## 2017-08-02 DIAGNOSIS — I10 Essential (primary) hypertension: Secondary | ICD-10-CM | POA: Diagnosis present

## 2017-08-02 DIAGNOSIS — J441 Chronic obstructive pulmonary disease with (acute) exacerbation: Secondary | ICD-10-CM | POA: Diagnosis present

## 2017-08-02 DIAGNOSIS — E860 Dehydration: Secondary | ICD-10-CM | POA: Diagnosis present

## 2017-08-02 LAB — COMPREHENSIVE METABOLIC PANEL
ALBUMIN: 4.2 g/dL (ref 3.5–5.0)
ALK PHOS: 63 U/L (ref 38–126)
ALT: 13 U/L — AB (ref 14–54)
AST: 29 U/L (ref 15–41)
Anion gap: 13 (ref 5–15)
BUN: 7 mg/dL (ref 6–20)
CALCIUM: 8.4 mg/dL — AB (ref 8.9–10.3)
CHLORIDE: 77 mmol/L — AB (ref 101–111)
CO2: 27 mmol/L (ref 22–32)
CREATININE: 0.57 mg/dL (ref 0.44–1.00)
GFR calc non Af Amer: >60 mL/min (ref >60)
GLUCOSE: 89 mg/dL (ref 65–99)
Potassium: 4.2 mmol/L (ref 3.5–5.1)
SODIUM: 117 mmol/L — AB (ref 135–145)
Total Bilirubin: 1.6 mg/dL — ABNORMAL HIGH (ref 0.3–1.2)
Total Protein: 7.3 g/dL (ref 6.5–8.1)

## 2017-08-02 LAB — CBC
HCT: 36.3 % (ref 35.0–47.0)
Hemoglobin: 12.4 g/dL (ref 12.0–16.0)
MCH: 33.6 pg (ref 26.0–34.0)
MCHC: 34.2 g/dL (ref 32.0–36.0)
MCV: 98.4 fL (ref 80.0–100.0)
PLATELETS: 330 10*3/uL (ref 150–440)
RBC: 3.69 MIL/uL — AB (ref 3.80–5.20)
RDW: 12.5 % (ref 11.5–14.5)
WBC: 4.6 10*3/uL (ref 3.6–11.0)

## 2017-08-02 LAB — TSH: TSH: 0.717 u[IU]/mL (ref 0.350–4.500)

## 2017-08-02 LAB — TROPONIN I: Troponin I: <0.03 ng/mL (ref <0.03)

## 2017-08-02 MED ORDER — IPRATROPIUM-ALBUTEROL 0.5-2.5 (3) MG/3ML IN SOLN
3.0000 mL | RESPIRATORY_TRACT | Status: DC
  Administered 2017-08-02 – 2017-08-05 (×14): 3 mL via RESPIRATORY_TRACT
  Filled 2017-08-02 (×17): qty 3

## 2017-08-02 MED ORDER — DOCUSATE SODIUM 100 MG PO CAPS
100.0000 mg | ORAL_CAPSULE | Freq: Two times a day (BID) | ORAL | Status: DC | PRN
  Administered 2017-08-03 – 2017-08-04 (×2): 100 mg via ORAL
  Filled 2017-08-02 (×2): qty 1

## 2017-08-02 MED ORDER — IOHEXOL 300 MG/ML  SOLN
75.0000 mL | Freq: Once | INTRAMUSCULAR | Status: AC | PRN
  Administered 2017-08-02: 75 mL via INTRAVENOUS

## 2017-08-02 MED ORDER — IPRATROPIUM-ALBUTEROL 0.5-2.5 (3) MG/3ML IN SOLN
3.0000 mL | Freq: Once | RESPIRATORY_TRACT | Status: AC
  Administered 2017-08-02: 3 mL via RESPIRATORY_TRACT
  Filled 2017-08-02: qty 3

## 2017-08-02 MED ORDER — MORPHINE SULFATE (PF) 2 MG/ML IV SOLN
2.0000 mg | INTRAVENOUS | Status: DC | PRN
  Administered 2017-08-02 – 2017-08-04 (×2): 2 mg via INTRAVENOUS
  Filled 2017-08-02 (×2): qty 1

## 2017-08-02 MED ORDER — SODIUM CHLORIDE 0.9 % IV SOLN
INTRAVENOUS | Status: DC
  Administered 2017-08-02: 19:00:00 via INTRAVENOUS
  Administered 2017-08-03 (×2): 75 mL/h via INTRAVENOUS
  Administered 2017-08-04: 11:00:00 via INTRAVENOUS
  Administered 2017-08-04: 75 mL/h via INTRAVENOUS

## 2017-08-02 MED ORDER — HEPARIN SODIUM (PORCINE) 5000 UNIT/ML IJ SOLN
5000.0000 [IU] | Freq: Three times a day (TID) | INTRAMUSCULAR | Status: DC
  Filled 2017-08-02: qty 1

## 2017-08-02 MED ORDER — ORAL CARE MOUTH RINSE
15.0000 mL | Freq: Two times a day (BID) | OROMUCOSAL | Status: DC
  Administered 2017-08-02 – 2017-08-04 (×3): 15 mL via OROMUCOSAL

## 2017-08-02 MED ORDER — TIOTROPIUM BROMIDE MONOHYDRATE 18 MCG IN CAPS
18.0000 ug | ORAL_CAPSULE | Freq: Every day | RESPIRATORY_TRACT | Status: DC
  Administered 2017-08-03: 18 ug via RESPIRATORY_TRACT
  Filled 2017-08-02: qty 5

## 2017-08-02 MED ORDER — PREDNISONE 50 MG PO TABS
50.0000 mg | ORAL_TABLET | Freq: Every day | ORAL | Status: DC
  Administered 2017-08-03 – 2017-08-05 (×3): 50 mg via ORAL
  Filled 2017-08-02 (×3): qty 1

## 2017-08-02 MED ORDER — POTASSIUM CHLORIDE ER 10 MEQ PO TBCR
10.0000 meq | EXTENDED_RELEASE_TABLET | Freq: Every day | ORAL | Status: DC
Start: 2017-08-02 — End: 2017-08-05
  Administered 2017-08-02 – 2017-08-04 (×3): 10 meq via ORAL
  Filled 2017-08-02 (×7): qty 1

## 2017-08-02 MED ORDER — ASPIRIN 81 MG PO CHEW
81.0000 mg | CHEWABLE_TABLET | Freq: Every day | ORAL | Status: DC
  Administered 2017-08-03 – 2017-08-04 (×2): 81 mg via ORAL
  Filled 2017-08-02 (×2): qty 1

## 2017-08-02 MED ORDER — MIRTAZAPINE 15 MG PO TABS
7.5000 mg | ORAL_TABLET | Freq: Every day | ORAL | Status: DC
  Administered 2017-08-02 – 2017-08-04 (×3): 7.5 mg via ORAL
  Filled 2017-08-02 (×3): qty 1

## 2017-08-02 MED ORDER — TIZANIDINE HCL 2 MG PO TABS
2.0000 mg | ORAL_TABLET | Freq: Three times a day (TID) | ORAL | Status: DC | PRN
Start: 2017-08-02 — End: 2017-08-05
  Administered 2017-08-03 – 2017-08-04 (×4): 2 mg via ORAL
  Filled 2017-08-02 (×6): qty 1

## 2017-08-02 MED ORDER — TRAZODONE HCL 100 MG PO TABS
100.0000 mg | ORAL_TABLET | Freq: Every day | ORAL | Status: DC
  Administered 2017-08-02 – 2017-08-04 (×3): 100 mg via ORAL
  Filled 2017-08-02 (×3): qty 1

## 2017-08-02 MED ORDER — HYDROCODONE-ACETAMINOPHEN 5-325 MG PO TABS
1.0000 | ORAL_TABLET | Freq: Four times a day (QID) | ORAL | Status: DC | PRN
  Administered 2017-08-02 – 2017-08-03 (×2): 1 via ORAL
  Filled 2017-08-02 (×2): qty 1

## 2017-08-02 MED ORDER — HYDROMORPHONE HCL 1 MG/ML IJ SOLN
0.5000 mg | Freq: Once | INTRAMUSCULAR | Status: AC
  Administered 2017-08-02: 0.5 mg via INTRAVENOUS
  Filled 2017-08-02: qty 1

## 2017-08-02 MED ORDER — SODIUM CHLORIDE 0.9 % IV SOLN
Freq: Once | INTRAVENOUS | Status: AC
  Administered 2017-08-02: 15:00:00 via INTRAVENOUS

## 2017-08-02 MED ORDER — DILTIAZEM HCL 30 MG PO TABS
60.0000 mg | ORAL_TABLET | Freq: Two times a day (BID) | ORAL | Status: DC
  Administered 2017-08-02 – 2017-08-04 (×5): 60 mg via ORAL
  Filled 2017-08-02 (×5): qty 2

## 2017-08-02 MED ORDER — CALCIUM CARBONATE-VITAMIN D 500-200 MG-UNIT PO TABS
1.0000 | ORAL_TABLET | Freq: Every day | ORAL | Status: DC
  Administered 2017-08-03 – 2017-08-05 (×3): 1 via ORAL
  Filled 2017-08-02 (×2): qty 1

## 2017-08-02 MED ORDER — CARISOPRODOL 350 MG PO TABS
350.0000 mg | ORAL_TABLET | Freq: Three times a day (TID) | ORAL | Status: DC | PRN
  Administered 2017-08-03 – 2017-08-05 (×6): 350 mg via ORAL
  Filled 2017-08-02 (×6): qty 1

## 2017-08-02 MED ORDER — ALENDRONATE SODIUM 70 MG PO TABS: 70.0000 mg | ORAL_TABLET | ORAL | Status: DC

## 2017-08-02 MED ORDER — METHYLPREDNISOLONE SODIUM SUCC 125 MG IJ SOLR
125.0000 mg | Freq: Once | INTRAMUSCULAR | Status: AC
  Administered 2017-08-02: 125 mg via INTRAVENOUS
  Filled 2017-08-02: qty 2

## 2017-08-02 NOTE — Progress Notes (Signed)
Family Meeting Note  Advance Directive:yes  Today a meeting took place with the Patient and sister.  The following clinical team members were present during this meeting:MD  The following were discussed:Patient's diagnosis: Lung cancer, Patient's progosis: Unable to determine and Goals for treatment: Limited CODE STATUS, patient would like to have CPR and defibrillation with noninvasive ventilatory support but she does not like to be intubated or put on ventilator machine if needed.  Additional follow-up to be provided: Orthopedic consult.  Time spent during discussion:20 minutes  Vaughan Basta, MD

## 2017-08-02 NOTE — ED Provider Notes (Signed)
[facility] Emergency Department Provider Note       Time seen: ----------------------------------------- 2:40 PM on 08/02/2017 -----------------------------------------   I have reviewed the triage vital signs and the nursing notes.  HISTORY   Chief Complaint Shortness of Breath    HPI Rachael Palmer is a 62 y.o. female with a history of alcohol abuse, asthma, COPD, hypertension, lung mass who presents to the ED for right-sided back pain shortness of breath since Friday that is gotten worse today.  Patient is a history of asthma right-sided lung cancer.  Patient states she is having some cough but difficult to cough due to the pain.  Patient states she does not normally wear oxygen, has not fallen, family reports she is not drinking very well.  She was presented to the ER wheezing.  Past Medical History:  Diagnosis Date  . Alcohol abuse   . Asthma   . COPD (chronic obstructive pulmonary disease) ([place])   . Hypertension   . Lung mass   . Mass of lower lobe of right lung 12/11/2014  . Squamous cell carcinoma lung ([place])   . Squamous cell carcinoma of lung, stage I ([place]) 01/03/2015    Patient Active Problem List   Diagnosis Date Noted  . Squamous cell carcinoma of lung, stage I ([place]) 01/03/2015  . Lung mass   . Mass of lower lobe of right lung 12/11/2014  . Protein-calorie malnutrition, severe ([place]) 11/30/2014  . Hip fracture ([place]) 11/29/2014  . Compression fracture of lumbar vertebra ([place]) 09/16/2014  . Disorder of nutrition 03/01/2014  . Hieralgia 01/28/2014  . Lumbosacral radiculitis 01/28/2014  . Diabetes mellitus, type 2 ([place]) 08/29/2013  . Back ache 08/04/2013  . Chronic obstructive pulmonary emphysema ([place]) 08/04/2013  . Benign hypertension 08/04/2013  . Below normal amount of sodium in the blood 08/04/2013  . Arthritis, degenerative 08/04/2013    Past Surgical History:  Procedure Laterality Date  . broken arm repair Right   . CESAREAN  SECTION     x 3  . ELECTROMAGNETIC NAVIGATION BROCHOSCOPY N/A 12/26/2014   Procedure: ELECTROMAGNETIC NAVIGATION BRONCHOSCOPY;  Surgeon: Flora Lipps, MD;  Location: [facility] ORS;  Service: Cardiopulmonary;  Laterality: N/A;  . FEMUR IM NAIL Left 12/01/2014   Procedure: INTRAMEDULLARY (IM) NAIL FEMORAL;  Surgeon: Thornton Park, MD;  Location: [facility] ORS;  Service: Orthopedics;  Laterality: Left;  . HIP PINNING Left 2016  . HIP SURGERY Right 2013    Allergies Penicillins; Tape; and Aspirin  Social History Social History   Tobacco Use  . Smoking status: Current Every Day Smoker    Packs/day: 0.25    Years: 40.00    Pack years: 10.00    Types: Cigarettes  . Smokeless tobacco: Never Used  . Tobacco comment: start smoking in late teens  Substance Use Topics  . Alcohol use: Yes    Alcohol/week: 12.0 oz    Types: 20 Cans of beer per week    Comment: pt denies use of alcohol as of 11/29/14 "6 pack a day"  . Drug use: No   Review of Systems Constitutional: Negative for fever. Cardiovascular: Negative for chest pain. Respiratory: Positive for shortness of breath Gastrointestinal: Negative for abdominal pain, vomiting and diarrhea. Musculoskeletal: Positive for back pain Skin: Negative for rash. Neurological: Negative for headaches, positive for weakness  All systems negative/normal/unremarkable except as stated in the HPI  ____________________________________________   PHYSICAL EXAM:  VITAL SIGNS: ED Triage Vitals  Enc Vitals Group     BP 08/02/17 1256 Marland Kitchen)  176/103     Pulse Rate 08/02/17 1256 (!) 110     Resp 08/02/17 1256 18     Temp 08/02/17 1256 98.4 F (36.9 C)     Temp Source 08/02/17 1256 Oral     SpO2 08/02/17 1256 90 %     Weight 08/02/17 1327 70 lb 12.8 oz (32.1 kg)     Height 08/02/17 1327 4\' 10"  (1.473 m)     Head Circumference --      Peak Flow --      Pain Score 08/02/17 1327 10     Pain Loc --      Pain Edu? --      Excl. in [place]? --    Constitutional: Alert  and oriented.  Chronically ill-appearing, mild distress Eyes: Conjunctivae are normal. Normal extraocular movements. ENT   Head: Normocephalic and atraumatic.   Nose: No congestion/rhinnorhea.   Mouth/Throat: Mucous membranes are moist.   Neck: No stridor. Cardiovascular: Normal rate, regular rhythm. No murmurs, rubs, or gallops. Respiratory: Prolonged expiration with wheezing bilaterally Gastrointestinal: Soft and nontender. Normal bowel sounds Musculoskeletal: Nontender with normal range of motion in extremities.  Midline thoracic spine tenderness.  Significant kyphosis is noted Neurologic:  Normal speech and language. No gross focal neurologic deficits are appreciated.  Skin:  Skin is warm, dry and intact. No rash noted. Psychiatric: Depressed mood and affect ____________________________________________  EKG: Interpreted by me.  Sinus tachycardia with a rate of 104 bpm, normal PR interval, normal QRS, normal QT.  ____________________________________________  ED COURSE:  As part of my medical decision making, I reviewed the following data within the [place] History obtained from family if available, nursing notes, old chart and ekg, as well as notes from prior ED visits. Patient presented for back pain and shortness of breath, we will assess with labs and imaging as indicated at this time.   Procedures ____________________________________________   LABS (pertinent positives/negatives)  Labs Reviewed  COMPREHENSIVE METABOLIC PANEL - Abnormal; Notable for the following components:      Result Value   Sodium 117 (*)    Chloride 77 (*)    Calcium 8.4 (*)    ALT 13 (*)    Total Bilirubin 1.6 (*)    All other components within normal limits  CBC - Abnormal; Notable for the following components:   RBC 3.69 (*)    All other components within normal limits  TROPONIN I    RADIOLOGY Chest x-ray IMPRESSION: Multiple thoracic compression fractures.  The T8 compression appears new since December 2018. Partial compressions of T4, T5, and T6 and T10, T11, and T12 are stable.  COPD. No alveolar pneumonia. No definite pulmonary parenchymal masses. No CHF.  Thoracic aortic atherosclerosis.  ____________________________________________  DIFFERENTIAL DIAGNOSIS   PE, COPD, pneumonia, pneumothorax, compression fracture  FINAL ASSESSMENT AND PLAN  Hyponatremia, T8 compression fracture, COPD exacerbation   Plan: The patient had presented for pain and shortness of breath. Patient's labs did indicate severe hyponatremia with a sodium of 117. Patient's imaging also revealed an acute T8 compression fraction on top of 6 other chronic appearing compression fractures in the thoracic spine.  She was given Dilaudid for pain and started on IV fluids.  She also received a DuoNeb and received steroids for COPD.  I will discuss with the hospitalist for admission.   Laurence Aly, MD   Note: This note was generated in part or whole with voice recognition software. Voice recognition is usually quite accurate but  there are transcription errors that can and very often do occur. I apologize for any typographical errors that were not detected and corrected.     Earleen Newport, MD 08/02/17 1444

## 2017-08-02 NOTE — ED Notes (Signed)
Patient placed on 2L of oxygen by [place].

## 2017-08-02 NOTE — ED Triage Notes (Signed)
Pt to ED c/o SOB, right side back pain since Friday getting worse to today.  Pt has hx of asthma and right sided lung cancer.  States some cough but difficult d/t pain.  Patient was placed on oxygen in triage, does not normally wear oxygen.  Pt with inspiratory wheezes throughout.

## 2017-08-02 NOTE — H&P (Signed)
[facility] [place] NAME: Rachael Palmer    MR#:  [id number]  DATE OF BIRTH:  1955-08-10  DATE OF ADMISSION:  08/02/2017  PRIMARY CARE PHYSICIAN: Kirk Ruths, MD   REQUESTING/REFERRING PHYSICIAN: Jimmye Norman  CHIEF COMPLAINT:   Chief Complaint  Patient presents with  . Shortness of Breath    HISTORY OF PRESENT ILLNESS: Rachael Palmer  is a 62 y.o. female with a known history of Alcohol abuse, COPD, asthma, Hx of lung cancer- at baseline very thin and weak. Able to take care of herself. Lives with her mother and does not need oxygen at home. Came after her back started hurting for last 3-4 days. She also had cough and some SOB. She denies any fall or injuries. She said, she always had low sodium and her doctor had told her to drink " Gatorad". She takes Torsemide at home. Noted to have Low sodium and New vertebral fracture. She had actually gained weight in last 6 months. She is also noted to have Wheezing on lung exam, had been smoking actively till now.  PAST MEDICAL HISTORY:   Past Medical History:  Diagnosis Date  . Alcohol abuse   . Asthma   . COPD (chronic obstructive pulmonary disease) ([place])   . Hypertension   . Lung mass   . Mass of lower lobe of right lung 12/11/2014  . Squamous cell carcinoma lung ([place])   . Squamous cell carcinoma of lung, stage I ([place]) 01/03/2015    PAST SURGICAL HISTORY:  Past Surgical History:  Procedure Laterality Date  . broken arm repair Right   . CESAREAN SECTION     x 3  . ELECTROMAGNETIC NAVIGATION BROCHOSCOPY N/A 12/26/2014   Procedure: ELECTROMAGNETIC NAVIGATION BRONCHOSCOPY;  Surgeon: Flora Lipps, MD;  Location: [facility] ORS;  Service: Cardiopulmonary;  Laterality: N/A;  . FEMUR IM NAIL Left 12/01/2014   Procedure: INTRAMEDULLARY (IM) NAIL FEMORAL;  Surgeon: Thornton Park, MD;  Location: [facility] ORS;  Service: Orthopedics;  Laterality: Left;  . HIP PINNING Left 2016  . HIP SURGERY Right 2013     SOCIAL HISTORY:  Social History   Tobacco Use  . Smoking status: Current Every Day Smoker    Packs/day: 0.25    Years: 40.00    Pack years: 10.00    Types: Cigarettes  . Smokeless tobacco: Never Used  . Tobacco comment: start smoking in late teens  Substance Use Topics  . Alcohol use: Yes    Alcohol/week: 12.0 oz    Types: 20 Cans of beer per week    Comment: pt denies use of alcohol as of 11/29/14 "6 pack a day"    FAMILY HISTORY:  Family History  Problem Relation Age of Onset  . Hypertension Mother   . Cirrhosis Father     DRUG ALLERGIES:  Allergies  Allergen Reactions  . Penicillins Hives, Itching and Rash  . Tape Other (See Comments)    Irritation to skin.  Paper tape ok.  . Aspirin Rash    REVIEW OF SYSTEMS:   CONSTITUTIONAL: No fever, fatigue or weakness.  EYES: No blurred or double vision.  EARS, NOSE, AND THROAT: No tinnitus or ear pain.  RESPIRATORY: No cough, shortness of breath, wheezing or hemoptysis.  CARDIOVASCULAR: No chest pain, orthopnea, edema.  GASTROINTESTINAL: No nausea, vomiting, diarrhea or abdominal pain.  GENITOURINARY: No dysuria, hematuria.  ENDOCRINE: No polyuria, nocturia,  HEMATOLOGY: No anemia, easy bruising or bleeding SKIN: No rash or lesion. MUSCULOSKELETAL: No  joint pain or arthritis.  Have back pain. NEUROLOGIC: No tingling, numbness, weakness.  PSYCHIATRY: No anxiety or depression.   MEDICATIONS AT HOME:  Prior to Admission medications   Medication Sig Start Date End Date Taking? Authorizing Provider  albuterol (PROAIR HFA) 108 (90 Base) MCG/ACT inhaler INHALE 2 INHALATIONS INTO THE LUNGS EVERY 4 (FOUR) HOURS AS NEEDED FOR WHEEZING. 10/20/15  Yes [provider]  aspirin 81 MG chewable tablet Chew 81 mg by mouth daily.   Yes [provider]  calcium-vitamin D (OSCAL WITH D) 500-200 MG-UNIT per tablet Take 1 tablet by mouth daily with breakfast.    Yes [provider]  carisoprodol (SOMA) 350 MG  tablet Take 175 mg by mouth 2 (two) times daily as needed for muscle spasms.    Yes [provider]  diltiazem (CARDIZEM) 60 MG tablet Take 60 mg by mouth 2 (two) times daily.   Yes [provider]  Fluticasone-Salmeterol (ADVAIR) 250-50 MCG/DOSE AEPB Inhale 1 puff into the lungs 2 (two) times daily.   Yes [provider]  ibandronate (BONIVA) 150 MG tablet TAKE 1 TABLET BY MOUTH EVERY 30 DAYS WITH FULL GLASS OF WATER. DO NOT LIE DOWN FOR 60 MINS 09/26/16  Yes [provider]  oxyCODONE-acetaminophen (PERCOCET/ROXICET) 5-325 MG tablet Take  tablet by mouth 3 times daily   Yes [provider]  potassium chloride (K-DUR) 10 MEQ tablet Take 10 mEq by mouth daily.   Yes [provider]  torsemide (DEMADEX) 5 MG tablet TAKE 1 TABLET (5 MG TOTAL) BY MOUTH ONCE DAILY. 06/12/15  Yes [provider]  traZODone (DESYREL) 100 MG tablet Take 100 mg by mouth at bedtime.   Yes [provider]  enoxaparin (LOVENOX) 30 MG/0.3ML injection Inject 0.3 mLs (30 mg total) into the skin daily. Patient not taking: Reported on 10/05/2016 12/03/14   Max Sane, MD  HYDROcodone-acetaminophen (NORCO/VICODIN) 5-325 MG per tablet Take 1 tablet by mouth every 6 (six) hours as needed for moderate pain. Patient not taking: Reported on 03/11/2016 01/21/15   Noreene Filbert, MD  traMADol (ULTRAM) 50 MG tablet Take 1 tablet (50 mg total) by mouth every 8 (eight) hours as needed for moderate pain. Patient not taking: Reported on 08/02/2017 12/03/14   Max Sane, MD      PHYSICAL EXAMINATION:   VITAL SIGNS: Blood pressure (!) 159/115, pulse (!) 106, temperature 98.4 F (36.9 C), temperature source Oral, resp. rate 15, height 4\' 10"  (1.473 m), weight 32.1 kg (70 lb 12.8 oz), SpO2 100 %.  GENERAL:  62 y.o.-year-old patient lying in the bed with no acute distress.  EYES: Pupils equal, round, reactive to light and accommodation. No scleral icterus. Extraocular muscles  intact.  HEENT: Head atraumatic, normocephalic. Oropharynx and nasopharynx clear.  NECK:  Supple, no jugular venous distention. No thyroid enlargement, no tenderness.  LUNGS: Normal breath sounds bilaterally, b/l wheezing, no crepitation. No use of accessory muscles of respiration.  CARDIOVASCULAR: S1, S2 normal. No murmurs, rubs, or gallops.  ABDOMEN: Soft, nontender, nondistended. Bowel sounds present. No organomegaly or mass.  EXTREMITIES: No pedal edema, cyanosis, or clubbing.  NEUROLOGIC: Cranial nerves II through XII are intact. Muscle strength 5/5 in all extremities. Sensation intact. Gait not checked.  PSYCHIATRIC: The patient is alert and oriented x 3.  SKIN: No obvious rash, lesion, or ulcer.   LABORATORY PANEL:   CBC Recent Labs  Lab 08/02/17 1336  WBC 4.6  HGB 12.4  HCT 36.3  PLT 330  MCV  98.4  MCH 33.6  MCHC 34.2  RDW 12.5   ------------------------------------------------------------------------------------------------------------------  Chemistries  Recent Labs  Lab 08/02/17 1336  NA 117*  K 4.2  CL 77*  CO2 27  GLUCOSE 89  BUN 7  CREATININE 0.57  CALCIUM 8.4*  AST 29  ALT 13*  ALKPHOS 63  BILITOT 1.6*   ------------------------------------------------------------------------------------------------------------------ estimated creatinine clearance is 37.4 mL/min (by C-G formula based on SCr of 0.57 mg/dL). ------------------------------------------------------------------------------------------------------------------ No results for input(s): TSH, T4TOTAL, T3FREE, THYROIDAB in the last 72 hours.  Invalid input(s): FREET3   Coagulation profile No results for input(s): INR, PROTIME in the last 168 hours. ------------------------------------------------------------------------------------------------------------------- No results for input(s): DDIMER in the last 72  hours. -------------------------------------------------------------------------------------------------------------------  Cardiac Enzymes Recent Labs  Lab 08/02/17 1336  TROPONINI <0.03   ------------------------------------------------------------------------------------------------------------------ Invalid input(s): POCBNP  ---------------------------------------------------------------------------------------------------------------  Urinalysis    Component Value Date/Time   COLORURINE Yellow 10/24/2012 0632   APPEARANCEUR Clear 10/24/2012 0632   LABSPEC 1.005 10/24/2012 0632   PHURINE 6.0 10/24/2012 0632   GLUCOSEU Negative 10/24/2012 0632   HGBUR Negative 10/24/2012 0632   BILIRUBINUR Negative 10/24/2012 0632   KETONESUR Negative 10/24/2012 0632   PROTEINUR Negative 10/24/2012 0632   NITRITE Negative 10/24/2012 0632   LEUKOCYTESUR Negative 10/24/2012 9485     RADIOLOGY: Dg Chest 2 View  Result Date: 08/02/2017 CLINICAL DATA:  Shortness of breath and right-sided back pain for the past 4 days with increasing severity today. History of COPD, right-sided lung malignancy, asthma, and cough. Current smoker. EXAM: CHEST - 2 VIEW COMPARISON:  CT scan of the chest of March 30, 2017 and chest x-ray of December 01, 2014. FINDINGS: The lungs are hyperinflated. The interstitial markings are coarse. There is no alveolar infiltrate. There is no pleural effusion. The heart and pulmonary vascularity are normal. The mediastinum is normal in width. There is calcification in the wall of the thoracic aorta. There is multilevel partial vertebral body compression of the thoracic spine. IMPRESSION: Multiple thoracic compression fractures. The T8 compression appears new since December 2018. Partial compressions of T4, T5, and T6 and T10, T11, and T12 are stable. COPD. No alveolar pneumonia. No definite pulmonary parenchymal masses. No CHF. Thoracic aortic atherosclerosis. Electronically Signed   By:  David  Martinique M.D.   On: 08/02/2017 14:04    EKG: Orders placed or performed during the hospital encounter of 08/02/17  . ED EKG  . ED EKG    IMPRESSION AND PLAN:  * Hyponatremia   Due to Torsemide. Check TSH   Hold diuretics and give Give IV fluids.  * Vertebral fracture    Without major injury   Will get CT of chest to r/o mets and to assess on her original Lung cancer.    Last CT had shown some nodule in right lung.   Pain control.   Foley cath on pt request as she can not move in bed much due to pain.  * COPD exacerbation   Duoneb, prednisone oral daily.   Supplemental oxygen now, try to taper.  * Active smoking   Councelled to quit for 4 min.  * hx of lung cancer    CT chest.      All the records are reviewed and case discussed with ED provider. Management plans discussed with the patient, family and they are in agreement.  CODE STATUS: Limited, SHe want CPR and Defibrillation, but no Intubation or vent support.  Code Status History    Date Active Date Inactive Code Status Order ID  Comments User Context   12/01/2014 2006 12/03/2014 1647 Full Code [id number]  Thornton Park, MD Inpatient   11/29/2014 2234 12/01/2014 2006 Full Code [id number]  Henreitta Leber, MD Inpatient   11/29/2014 1904 11/29/2014 2234 Full Code [id number]  Thornton Park, MD ED     Discussed with her sister in room.  TOTAL TIME TAKING CARE OF THIS PATIENT: 45 minutes.    Vaughan Basta M.D on 08/02/2017   Between 7am to 6pm - Pager - [phone]  After 6pm go to [email or web address] - password [email or web address] [facility]  Office  [phone]  CC: Primary care physician; Kirk Ruths, MD   Note: This dictation was prepared with Dragon dictation along with smaller phrase technology. Any transcriptional errors that result from this process are unintentional.

## 2017-08-03 LAB — BASIC METABOLIC PANEL
ANION GAP: 7 (ref 5–15)
BUN: 11 mg/dL (ref 6–20)
CALCIUM: 7.9 mg/dL — AB (ref 8.9–10.3)
CO2: 26 mmol/L (ref 22–32)
Chloride: 92 mmol/L — ABNORMAL LOW (ref 101–111)
Creatinine, Ser: 0.54 mg/dL (ref 0.44–1.00)
GFR calc Af Amer: >60 mL/min (ref >60)
Glucose, Bld: 162 mg/dL — ABNORMAL HIGH (ref 65–99)
Potassium: 4.7 mmol/L (ref 3.5–5.1)
SODIUM: 125 mmol/L — AB (ref 135–145)

## 2017-08-03 LAB — CBC
HCT: 33.2 % — ABNORMAL LOW (ref 35.0–47.0)
Hemoglobin: 11.2 g/dL — ABNORMAL LOW (ref 12.0–16.0)
MCH: 33.9 pg (ref 26.0–34.0)
MCHC: 33.8 g/dL (ref 32.0–36.0)
MCV: 100.5 fL — ABNORMAL HIGH (ref 80.0–100.0)
Platelets: 304 10*3/uL (ref 150–440)
RBC: 3.31 MIL/uL — ABNORMAL LOW (ref 3.80–5.20)
RDW: 12.6 % (ref 11.5–14.5)
WBC: 3.7 10*3/uL (ref 3.6–11.0)

## 2017-08-03 MED ORDER — ENSURE ENLIVE PO LIQD
237.0000 mL | Freq: Three times a day (TID) | ORAL | Status: DC
  Administered 2017-08-03 – 2017-08-04 (×4): 237 mL via ORAL

## 2017-08-03 MED ORDER — PANTOPRAZOLE SODIUM 40 MG PO TBEC
40.0000 mg | DELAYED_RELEASE_TABLET | Freq: Every day | ORAL | Status: DC
  Administered 2017-08-03 – 2017-08-05 (×3): 40 mg via ORAL
  Filled 2017-08-03 (×2): qty 1

## 2017-08-03 MED ORDER — HYDROCODONE-ACETAMINOPHEN 5-325 MG PO TABS
1.0000 | ORAL_TABLET | ORAL | Status: DC | PRN
  Administered 2017-08-03 – 2017-08-05 (×10): 2 via ORAL
  Filled 2017-08-03 (×10): qty 2

## 2017-08-03 MED ORDER — ENOXAPARIN SODIUM 30 MG/0.3ML ~~LOC~~ SOLN
30.0000 mg | SUBCUTANEOUS | Status: DC
  Administered 2017-08-03 – 2017-08-04 (×2): 30 mg via SUBCUTANEOUS
  Filled 2017-08-03 (×2): qty 0.3

## 2017-08-03 NOTE — Progress Notes (Signed)
Initial Nutrition Assessment  DOCUMENTATION CODES:   Severe malnutrition in context of chronic illness, Underweight  INTERVENTION:  Provide Ensure Enlive po TID, each supplement provides 350 kcal and 20 grams of protein.   RD ordered strawberry milkshake to come daily from kitchen at 1400 per patient request.  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   Met with patient at bedside. She was very difficult to get a history from as she kept bringing up things in conversation that were very off-task from nutrition assessment. She reports her appetite is "fine." She eats 2 meals per day at home. Reports they are more snacks than meals but unable to provide a specific example. She loves Ensure and wants to drink 3 daily here. She also wants a strawberry milkshake from the cafeteria daily. She has dentures but denies any trouble chewing/swallowing. Also denies any N/V or abdominal pain.  Patient reports she is unsure of her UBW. She feels like she started losing weight after her hip surgery, which was also about the same time they found her SCC and she started XRT. Per chart she was 68.6 lbs on 01/03/2015. She was 70.9 lbs on 10/05/2016 and 64.7 lbs on 05/11/2017. That is a weight loss of 6.2 lbs (8.7% body weight) over 7 months, which is not significant for time frame. She is now up to 70.8  lbs.  Medications reviewed and include: Cardizem, Remeron 7.5 mg QHS, pantoprazole, potassium chloride 10 mEq daily, prednisone 50 mg daily, NS @ 75 mL/hr.  Labs reviewed: Sodium 125 (improved from 117 yesterday), Chloride 92, Glucose 162.  NUTRITION - FOCUSED PHYSICAL EXAM:    Most Recent Value  Orbital Region  Moderate depletion  Upper Arm Region  Severe depletion  Thoracic and Lumbar Region  Severe depletion  Buccal Region  Moderate depletion  Temple Region  Moderate depletion  Clavicle Bone Region  Severe depletion  Clavicle and Acromion Bone Region  Severe depletion  Scapular Bone Region  Severe depletion  Dorsal Hand  Severe depletion  Patellar Region  Severe depletion  Anterior Thigh Region  Severe depletion  Posterior Calf Region  Severe depletion  Edema (RD Assessment)  None  Hair  Reviewed  Eyes  Reviewed  Mouth  Reviewed [dentures]  Skin  Reviewed  Nails  Reviewed     Diet Order:  Diet regular Room service appropriate? Yes; Fluid consistency: Thin  EDUCATION NEEDS:   No education needs have been identified at this time  Skin:  Skin Assessment: Reviewed RN Assessment  Last BM:  Unknown  Height:   Ht Readings from Last 1 Encounters:  08/02/17 4' 10"  (1.473 m)    Weight:   Wt Readings from Last 1 Encounters:  08/02/17 70 lb 12.8 oz (32.1 kg)    Ideal Body Weight:  43.9 kg  BMI:  Body mass index is 14.8 kg/m.  Estimated  Nutritional Needs:   Kcal:  1200-1400 kcal  Protein:  60-70 grams  Fluid:  1.2 L/day  Willey Blade, MS, RD, LDN Office: [phone] Pager: [phone] After Hours/Weekend Pager: [phone]

## 2017-08-03 NOTE — Progress Notes (Signed)
Patient c/o pain even though her last of Norco was 0454, 1 tab.  Notified MD.  New orders to change New orders for 1-2 tabs Q4H, PRN.  Notified patient and gave dose with new parameter.

## 2017-08-03 NOTE — Progress Notes (Signed)
[facility] [place] NAME: Rachael Palmer    MR#:  [id number]  DATE OF BIRTH:  08-26-1955  SUBJECTIVE: Patient is admitted for back pain and found to have T8 compression fracture.  Patient states that her pain is controlled with pain medicine, patient also has hyponatremia, sodium 117 admission negative to date is 125.  She is ambulating with pain medicine and does not want any home health.  CHIEF COMPLAINT:   Chief Complaint  Patient presents with  . Shortness of Breath    REVIEW OF SYSTEMS:    Review of Systems  Constitutional: Negative for chills and fever.  HENT: Negative for hearing loss.   Eyes: Negative for blurred vision, double vision and photophobia.  Respiratory: Positive for cough. Negative for hemoptysis and shortness of breath.   Cardiovascular: Negative for palpitations, orthopnea and leg swelling.  Gastrointestinal: Negative for abdominal pain, diarrhea and vomiting.  Genitourinary: Negative for dysuria and urgency.  Musculoskeletal: Positive for back pain. Negative for myalgias and neck pain.  Skin: Negative for rash.  Neurological: Negative for dizziness, focal weakness, seizures, weakness and headaches.  Psychiatric/Behavioral: Negative for memory loss. The patient does not have insomnia.     Nutrition:  Tolerating Diet: Tolerating PT:      DRUG ALLERGIES:   Allergies  Allergen Reactions  . Penicillins Hives, Itching and Rash  . Tape Other (See Comments)    Irritation to skin.  Paper tape ok.  . Aspirin Rash    VITALS:  Blood pressure (!) 151/80, pulse (!) 111, temperature 97.9 F (36.6 C), temperature source Oral, resp. rate 18, height 4\' 10"  (1.473 m), weight 32.1 kg (70 lb 12.8 oz), SpO2 95 %.  PHYSICAL EXAMINATION:   Physical Exam  GENERAL:  62 y.o.-year-old patient lying in the bed with no acute distress.  EYES: Pupils equal, round, reactive to light and accommodation. No scleral icterus.  Extraocular muscles intact.  HEENT: Head atraumatic, normocephalic. Oropharynx and nasopharynx clear.  NECK:  Supple, no jugular venous distention. No thyroid enlargement, no tenderness.  LUNGS: expiratory wheeze bilaterally.  noRales,rhonchi or crepitation. No use of accessory muscles of respiration.  CARDIOVASCULAR: S1, S2 normal. No murmurs, rubs, or gallops.  ABDOMEN: Soft, nontender, nondistended. Bowel sounds present. No organomegaly or mass.  EXTREMITIES: No pedal edema, cyanosis, or clubbing.  NEUROLOGIC: Cranial nerves II through XII are intact. Muscle strength 5/5 in all extremities. Sensation intact. Gait not checked.  PSYCHIATRIC: The patient is alert and oriented x 3.  SKIN: No obvious rash, lesion, or ulcer.    LABORATORY PANEL:   CBC Recent Labs  Lab 08/03/17 0604  WBC 3.7  HGB 11.2*  HCT 33.2*  PLT 304   ------------------------------------------------------------------------------------------------------------------  Chemistries  Recent Labs  Lab 08/02/17 1336 08/03/17 0604  NA 117* 125*  K 4.2 4.7  CL 77* 92*  CO2 27 26  GLUCOSE 89 162*  BUN 7 11  CREATININE 0.57 0.54  CALCIUM 8.4* 7.9*  AST 29  --   ALT 13*  --   ALKPHOS 63  --   BILITOT 1.6*  --    ------------------------------------------------------------------------------------------------------------------  Cardiac Enzymes Recent Labs  Lab 08/02/17 1336  TROPONINI <0.03   ------------------------------------------------------------------------------------------------------------------  RADIOLOGY:  Dg Chest 2 View  Result Date: 08/02/2017 CLINICAL DATA:  Shortness of breath and right-sided back pain for the past 4 days with increasing severity today. History of COPD, right-sided lung malignancy, asthma, and cough. Current smoker. EXAM: CHEST - 2  VIEW COMPARISON:  CT scan of the chest of March 30, 2017 and chest x-ray of December 01, 2014. FINDINGS: The lungs are hyperinflated. The  interstitial markings are coarse. There is no alveolar infiltrate. There is no pleural effusion. The heart and pulmonary vascularity are normal. The mediastinum is normal in width. There is calcification in the wall of the thoracic aorta. There is multilevel partial vertebral body compression of the thoracic spine. IMPRESSION: Multiple thoracic compression fractures. The T8 compression appears new since December 2018. Partial compressions of T4, T5, and T6 and T10, T11, and T12 are stable. COPD. No alveolar pneumonia. No definite pulmonary parenchymal masses. No CHF. Thoracic aortic atherosclerosis. Electronically Signed   By: David  Martinique M.D.   On: 08/02/2017 14:04   Ct Chest W Contrast  Result Date: 08/02/2017 CLINICAL DATA:  Shortness of breath.  History of lung cancer. EXAM: CT CHEST WITH CONTRAST TECHNIQUE: Multidetector CT imaging of the chest was performed during intravenous contrast administration. CONTRAST:  62mL OMNIPAQUE IOHEXOL 300 MG/ML  SOLN COMPARISON:  CT scan of March 31, 2017. FINDINGS: Cardiovascular: Atherosclerosis of thoracic aorta is noted without aneurysm or dissection. Normal cardiac size. No pericardial effusion. Mediastinum/Nodes: Stable right hilar density is noted with associated calcifications. Thyroid gland, trachea, and esophagus demonstrate no significant findings. Lungs/Pleura: No pneumothorax or pleural effusion is noted. Hyperexpansion of the lungs is noted with emphysematous disease seen in both upper lobes. Large irregular lesion seen in right lower lobe currently measures 5.0 x 1.9 cm which is not significantly changed compared to prior exam. Right upper lobe nodule noted on prior exam is no longer present. Upper Abdomen: No acute abnormality. Musculoskeletal: No chest wall abnormality. No acute or significant osseous findings. IMPRESSION: Stable large irregular lesion seen in right lower lobe compared to prior exam. Stable partially calcified right hilar density is also  noted. Aortic Atherosclerosis (ICD10-I70.0) and Emphysema (ICD10-J43.9). Electronically Signed   By: Marijo Conception, M.D.   On: 08/02/2017 16:32     ASSESSMENT AND PLAN:   Principal Problem:   Hyponatremia Active Problems:   Vertebral fracture, osteoporotic (HCC)   COPD exacerbation ([facility])   #1 acute hyponatremia due to dehydration, slowly with IV fluids, sodium from 117-125.  Continue IV fluids, recheck sodium tomorrow and likely discharge if continues to show improvement in sodium numbers. 2.  T8 compression fracture causing back pain, patient pain medication doses are adjusted, she states it is helping, she is able to ambulate to the bathroom.  Likely discharge home tomorrow when she is saying she does not want any home health.  Seen by orthopedic, continue Norco 1-2 tablets every 4 hours, prednisone dose taper.  Zanaflex, Soma 3.  COPD, no wheezing.  Continue inhalers. 4.  Hypertension, BP controlled with Cardizem 60 twice daily. For history of right lung cancer  discharge home tomorrow.   All the records are reviewed and case discussed with Care Management/Social Workerr. Management plans discussed with the patient, family and they are in agreement.  CODE STATUS: full code TOTAL TIME TAKING CARE OF THIS PATIENT: 61minutes.   POSSIBLE D/C IN 1-2 DAYS, DEPENDING ON CLINICAL CONDITION.   Epifanio Lesches M.D on 08/03/2017 at 2:45 PM  Between 7am to 6pm - Pager - [phone]  After 6pm go to [email or web address] - password [email or web address] [facility]  [facility]  Office  [phone]  CC: Primary care physician; Kirk Ruths, MD

## 2017-08-03 NOTE — Evaluation (Signed)
Physical Therapy Evaluation Patient Details Name: Rachael Palmer MRN: [id number] DOB: May 30, 1955 Today's Date: 08/03/2017   History of Present Illness  Pt is a 62 y.o. female presenting to hospital 08/02/17 with SOB, low sodium, and new T8 vertebral fx.  Pt admitted with hyponatremia, vertebral fx, and COPD exacerbation.  Imaging showing new T8 fx as well as stable partial compression fx's of T4, T5, T6, T10, T11, and T12.  PMH includes L IMN 12/01/14, alcohol abuse, asthma, COPD, htn, squamous cell carcinoma of lung stage I (R sided lung CA), h/o R broken arm repair.  Clinical Impression  Prior to hospital admission, pt was modified independent (most recently using [facility] for ambulation).  Pt lives with her mother in 1 level home with 2 steps to enter with L railing.  Currently pt is modified independent with bed mobility, independent with transfers, and CGA to SBA with ambulation 120 feet using SPC.  Minimal to no back pain during session (pt reports recent pain meds).  Pt would benefit from skilled PT to address noted impairments and functional limitations (see below for any additional details).  Upon hospital discharge, recommend pt discharge to home; no further PT needs anticipated upon hospital discharge (pt also reports she currently does not feel like she will need PT upon hospital discharge).    Follow Up Recommendations No PT follow up    Equipment Recommendations  Cane    Recommendations for Other Services       Precautions / Restrictions Precautions Precautions: Fall;Back Precaution Comments: spinal precautions Restrictions Weight Bearing Restrictions: Yes RLE Weight Bearing: Weight bearing as tolerated LLE Weight Bearing: Weight bearing as tolerated Other Position/Activity Restrictions: Per MD Posey Pronto (Ortho) note 08/03/17:  WBAT B LE's; no back brace needed; no restrictions other than what pain allows      Mobility  Bed Mobility Overal bed mobility: Modified Independent              General bed mobility comments: Supine to/from sit with HOB elevated without any noted difficulties.  Transfers Overall transfer level: Independent Equipment used: None             General transfer comment: sit to/from stand from bed and toilet steady and safe  Ambulation/Gait Ambulation/Gait assistance: Min guard;Supervision Ambulation Distance (Feet): 120 Feet Assistive device: Straight cane   Gait velocity: decreased initially but improved with distance ambulated   General Gait Details: decreased B step length initially but improved step length with distance  Stairs            Wheelchair Mobility    Modified Rankin (Stroke Patients Only)       Balance Overall balance assessment: Independent(pt able to ambulate into bathroom without AD and was steady with ambulation and washing hands at sink)                                           Pertinent Vitals/Pain Pain Assessment: Faces Pain Score: (pt reports minimal to no pain during/end of PT session) Faces Pain Scale: Hurts a little bit Pain Location: back Pain Descriptors / Indicators: Sore Pain Intervention(s): Limited activity within patient's tolerance;Monitored during session;Premedicated before session;Repositioned  O2 on room air stable and WFL throughout treatment session.  HR 117 bpm with activity.    Home Living Family/patient expects to be discharged to:: Private residence Living Arrangements: Parent(Pt's mother) Available Help at Discharge: Family Type of  Home: House Home Access: Stairs to enter Entrance Stairs-Rails: Left Entrance Stairs-Number of Steps: 2 Home Layout: One level Home Equipment: Shower seat;Hand held shower head;Cane - single point      Prior Function Level of Independence: Independent with assistive device(s)         Comments: Most recently ambulating with SPC.     Hand Dominance        Extremity/Trunk Assessment   Upper Extremity  Assessment Upper Extremity Assessment: (B UE AROM WFL with functional activities)    Lower Extremity Assessment Lower Extremity Assessment: (B LE AROM WFL with functional activities)    Cervical / Trunk Assessment Cervical / Trunk Assessment: Normal  Communication   Communication: No difficulties  Cognition Arousal/Alertness: Awake/alert Behavior During Therapy: WFL for tasks assessed/performed Overall Cognitive Status: Within Functional Limits for tasks assessed                                        General Comments General comments (skin integrity, edema, etc.): Pt resting in bed upon PT arrival.  Nursing cleared pt for participation in physical therapy.  Pt agreeable to PT session.  Pt educated on spinal precautions (pt verbalizing good understanding).    Exercises     Assessment/Plan    PT Assessment Patient needs continued PT services  PT Problem List Decreased activity tolerance;Decreased mobility;Decreased knowledge of precautions;Pain       PT Treatment Interventions DME instruction;Gait training;Stair training;Functional mobility training;Therapeutic activities;Therapeutic exercise;Patient/family education    PT Goals (Current goals can be found in the Care Plan section)  Acute Rehab PT Goals Patient Stated Goal: to have pain improved PT Goal Formulation: With patient Time For Goal Achievement: 08/17/17 Potential to Achieve Goals: Good    Frequency Min 2X/week   Barriers to discharge        Co-evaluation               AM-PAC PT "6 Clicks" Daily Activity  Outcome Measure Difficulty turning over in bed (including adjusting bedclothes, sheets and blankets)?: None Difficulty moving from lying on back to sitting on the side of the bed? : None Difficulty sitting down on and standing up from a chair with arms (e.g., wheelchair, bedside commode, etc,.)?: None Help needed moving to and from a bed to chair (including a wheelchair)?:  None Help needed walking in hospital room?: A Little Help needed climbing 3-5 steps with a railing? : A Little 6 Click Score: 22    End of Session Equipment Utilized During Treatment: Gait belt Activity Tolerance: Patient tolerated treatment well Patient left: in bed;with call bell/phone within reach Nurse Communication: Mobility status;Precautions;Other (comment)(pt's pain status; pt refusing bed alarm) PT Visit Diagnosis: Other abnormalities of gait and mobility (R26.89);Difficulty in walking, not elsewhere classified (R26.2);Muscle weakness (generalized) (M62.81)    Time: 2683-4196 PT Time Calculation (min) (ACUTE ONLY): 23 min   Charges:   PT Evaluation $PT Eval Low Complexity: 1 Low     PT G CodesLeitha Bleak, PT 08/03/17, 5:09 PM [phone]

## 2017-08-03 NOTE — Consult Note (Signed)
ORTHOPAEDIC CONSULTATION  REQUESTING PHYSICIAN: Epifanio Lesches, MD  Chief Complaint:   Back pain  History of Present Illness: Rachael Palmer is a 62 y.o. female with history of multiple prior compression fractures presenting with back pain and shortness of breath. She also has a history of lung cancer.  She is admitted for hyponatremia.  She has a 4 day history of back pain in midline and R paraspinal musculature.  She does not have a traumatic incident.  Pain was much more severe in nature, and is very mildly improving.  Pain is worse with attempted movement.  Pain is improved with rest.  She denies any recent fevers or chills.  She does not have any bowel or bladder changes.  She denies any weakness or numbness.  Past Medical History:  Diagnosis Date  . Alcohol abuse   . Asthma   . COPD (chronic obstructive pulmonary disease) ([place])   . Hypertension   . Lung mass   . Mass of lower lobe of right lung 12/11/2014  . Squamous cell carcinoma lung ([place])   . Squamous cell carcinoma of lung, stage I ([place]) 01/03/2015   Past Surgical History:  Procedure Laterality Date  . broken arm repair Right   . CESAREAN SECTION     x 3  . ELECTROMAGNETIC NAVIGATION BROCHOSCOPY N/A 12/26/2014   Procedure: ELECTROMAGNETIC NAVIGATION BRONCHOSCOPY;  Surgeon: Flora Lipps, MD;  Location: [facility] ORS;  Service: Cardiopulmonary;  Laterality: N/A;  . FEMUR IM NAIL Left 12/01/2014   Procedure: INTRAMEDULLARY (IM) NAIL FEMORAL;  Surgeon: Thornton Park, MD;  Location: [facility] ORS;  Service: Orthopedics;  Laterality: Left;  . HIP PINNING Left 2016  . HIP SURGERY Right 2013   Social History   Socioeconomic History  . Marital status: Single    Spouse name: Not on file  . Number of children: Not on file  . Years of education: Not on file  . Highest education level: Not on file  Occupational History  . Not on file  Social Needs  . Financial resource  strain: Not on file  . Food insecurity:    Worry: Not on file    Inability: Not on file  . Transportation needs:    Medical: Not on file    Non-medical: Not on file  Tobacco Use  . Smoking status: Current Every Day Smoker    Packs/day: 0.25    Years: 40.00    Pack years: 10.00    Types: Cigarettes  . Smokeless tobacco: Never Used  . Tobacco comment: start smoking in late teens  Substance and Sexual Activity  . Alcohol use: Yes    Alcohol/week: 12.0 oz    Types: 20 Cans of beer per week    Comment: pt denies use of alcohol as of 11/29/14 "6 pack a day"  . Drug use: No  . Sexual activity: Not Currently  Lifestyle  . Physical activity:    Days per week: Not on file    Minutes per session: Not on file  . Stress: Not on file  Relationships  . Social connections:    Talks on phone: Not on file    Gets together: Not on file    Attends religious service: Not on file    Active member of club or organization: Not on file    Attends meetings of clubs or organizations: Not on file    Relationship status: Not on file  Other Topics Concern  . Not on file  Social History Narrative  . Not  on file   Family History  Problem Relation Age of Onset  . Hypertension Mother   . Cirrhosis Father    Allergies  Allergen Reactions  . Penicillins Hives, Itching and Rash  . Tape Other (See Comments)    Irritation to skin.  Paper tape ok.  . Aspirin Rash   Prior to Admission medications   Medication Sig Start Date End Date Taking? Authorizing Provider  albuterol (PROAIR HFA) 108 (90 Base) MCG/ACT inhaler INHALE 2 INHALATIONS INTO THE LUNGS EVERY 4 (FOUR) HOURS AS NEEDED FOR WHEEZING. 10/20/15  Yes [provider]  aspirin 81 MG chewable tablet Chew 81 mg by mouth daily.   Yes [provider]  calcium-vitamin D (OSCAL WITH D) 500-200 MG-UNIT per tablet Take 1 tablet by mouth daily with breakfast.    Yes [provider]  carisoprodol (SOMA) 350 MG tablet Take 175 mg by  mouth 2 (two) times daily as needed for muscle spasms.    Yes [provider]  diltiazem (CARDIZEM) 60 MG tablet Take 60 mg by mouth 2 (two) times daily.   Yes [provider]  Fluticasone-Salmeterol (ADVAIR) 250-50 MCG/DOSE AEPB Inhale 1 puff into the lungs 2 (two) times daily.   Yes [provider]  ibandronate (BONIVA) 150 MG tablet TAKE 1 TABLET BY MOUTH EVERY 30 DAYS WITH FULL GLASS OF WATER. DO NOT LIE DOWN FOR 60 MINS 09/26/16  Yes [provider]  Omeprazole 20 MG TBDD Take 20 mg by mouth every morning.   Yes [provider]  oxyCODONE-acetaminophen (PERCOCET/ROXICET) 5-325 MG tablet Take  tablet by mouth 3 times daily   Yes [provider]  potassium chloride (K-DUR) 10 MEQ tablet Take 10 mEq by mouth daily.   Yes [provider]  torsemide (DEMADEX) 5 MG tablet TAKE 1 TABLET (5 MG TOTAL) BY MOUTH ONCE DAILY. 06/12/15  Yes [provider]  traZODone (DESYREL) 100 MG tablet Take 100 mg by mouth at bedtime.   Yes [provider]  enoxaparin (LOVENOX) 30 MG/0.3ML injection Inject 0.3 mLs (30 mg total) into the skin daily. Patient not taking: Reported on 10/05/2016 12/03/14   Max Sane, MD  HYDROcodone-acetaminophen (NORCO/VICODIN) 5-325 MG per tablet Take 1 tablet by mouth every 6 (six) hours as needed for moderate pain. Patient not taking: Reported on 03/11/2016 01/21/15   Noreene Filbert, MD  traMADol (ULTRAM) 50 MG tablet Take 1 tablet (50 mg total) by mouth every 8 (eight) hours as needed for moderate pain. Patient not taking: Reported on 08/02/2017 12/03/14   Max Sane, MD   Recent Labs    08/02/17 1336 08/03/17 0604  WBC 4.6 3.7  HGB 12.4 11.2*  HCT 36.3 33.2*  PLT 330 304  K 4.2 4.7  CL 77* 92*  CO2 27 26  BUN 7 11  CREATININE 0.57 0.54  GLUCOSE 89 162*  CALCIUM 8.4* 7.9*   Dg Chest 2 View  Result Date: 08/02/2017 CLINICAL DATA:  Shortness of breath and right-sided back pain for the past 4 days  with increasing severity today. History of COPD, right-sided lung malignancy, asthma, and cough. Current smoker. EXAM: CHEST - 2 VIEW COMPARISON:  CT scan of the chest of March 30, 2017 and chest x-ray of December 01, 2014. FINDINGS: The lungs are hyperinflated. The interstitial markings are coarse. There is no alveolar infiltrate. There is no pleural effusion. The heart and pulmonary vascularity are normal. The mediastinum is normal in width. There is calcification in the wall of  the thoracic aorta. There is multilevel partial vertebral body compression of the thoracic spine. IMPRESSION: Multiple thoracic compression fractures. The T8 compression appears new since December 2018. Partial compressions of T4, T5, and T6 and T10, T11, and T12 are stable. COPD. No alveolar pneumonia. No definite pulmonary parenchymal masses. No CHF. Thoracic aortic atherosclerosis. Electronically Signed   By: David  Martinique M.D.   On: 08/02/2017 14:04   Ct Chest W Contrast  Result Date: 08/02/2017 CLINICAL DATA:  Shortness of breath.  History of lung cancer. EXAM: CT CHEST WITH CONTRAST TECHNIQUE: Multidetector CT imaging of the chest was performed during intravenous contrast administration. CONTRAST:  23mL OMNIPAQUE IOHEXOL 300 MG/ML  SOLN COMPARISON:  CT scan of March 31, 2017. FINDINGS: Cardiovascular: Atherosclerosis of thoracic aorta is noted without aneurysm or dissection. Normal cardiac size. No pericardial effusion. Mediastinum/Nodes: Stable right hilar density is noted with associated calcifications. Thyroid gland, trachea, and esophagus demonstrate no significant findings. Lungs/Pleura: No pneumothorax or pleural effusion is noted. Hyperexpansion of the lungs is noted with emphysematous disease seen in both upper lobes. Large irregular lesion seen in right lower lobe currently measures 5.0 x 1.9 cm which is not significantly changed compared to prior exam. Right upper lobe nodule noted on prior exam is no longer present.  Upper Abdomen: No acute abnormality. Musculoskeletal: No chest wall abnormality. No acute or significant osseous findings. IMPRESSION: Stable large irregular lesion seen in right lower lobe compared to prior exam. Stable partially calcified right hilar density is also noted. Aortic Atherosclerosis (ICD10-I70.0) and Emphysema (ICD10-J43.9). Electronically Signed   By: Marijo Conception, M.D.   On: 08/02/2017 16:32     Positive ROS: All other systems have been reviewed and were otherwise negative with the exception of those mentioned in the HPI and as above.  Physical Exam: BP (!) 151/80 (BP Location: Left Arm)   Pulse (!) 111   Temp 97.9 F (36.6 C) (Oral)   Resp 18   Ht 4\' 10"  (1.473 m)   Wt 32.1 kg (70 lb 12.8 oz)   SpO2 95%   BMI 14.80 kg/m  General:  Alert, no acute distress Psychiatric:  Patient is competent for consent with normal mood and affect   Cardiovascular:  No pedal edema, regular rate and rhythm Respiratory:  No wheezing, non-labored breathing GI:  Abdomen is soft and non-tender Skin:  No lesions in the area of chief complaint, no erythema Neurologic:  Sensation intact distally, CN grossly intact Lymphatic:  No axillary or cervical lymphadenopathy  Orthopedic Exam:  Bilat LE: 5/5 DF/PF/EHL, HF/KF/KE SILT L2-S1 Feet wwp No pathologic reflexes or abnormal clonus .  X-rays & CT Scan:  As above: Confirm T8 vertebral fracture that was not present on CT scan from December 2018.  Old fractures from T4-T6 and T10-T12 are redemonstrated.  Assessment/Plan: 62 year old female with T8 vertebral fracture in the setting of multiple prior thoracic compression fractures.  1.  Weightbearing as tolerated on bilateral lower extremities.  No restrictions other than what pain allows.  2.  There is no need for a back brace at this time.  3.  Continue to treat pain with p.o. medications and IV as needed.  4.  Please page with any questions.  Follow-up as an outpatient in 2-3 weeks  if she is having continued back pain.    Leim Fabry   08/03/2017 12:56 PM

## 2017-08-03 NOTE — Progress Notes (Signed)
PT Cancellation Note  Patient Details Name: Rachael Palmer MRN: [id number] DOB: 18-Dec-1955   Cancelled Treatment:      PT consult received, chart reviewed. Per Imaging results (08/02/17): Multiple thoracic compression fractures. The T8 compression appears new since December 2018; Ortho consult not complete at this time.  Per PT guidelines, Pt not appropriate for PT at this time.  Will reattempt eval/treatment at a later date/time when medically appropriate.   Ahsan Esterline Mondrian-Pardue, SPT 08/03/2017, 10:12 AM

## 2017-08-04 LAB — BASIC METABOLIC PANEL
ANION GAP: 4 — AB (ref 5–15)
BUN: 12 mg/dL (ref 6–20)
CHLORIDE: 95 mmol/L — AB (ref 101–111)
CO2: 27 mmol/L (ref 22–32)
Calcium: 8.5 mg/dL — ABNORMAL LOW (ref 8.9–10.3)
Creatinine, Ser: 0.69 mg/dL (ref 0.44–1.00)
GFR calc Af Amer: >60 mL/min (ref >60)
GFR calc non Af Amer: >60 mL/min (ref >60)
GLUCOSE: 102 mg/dL — AB (ref 65–99)
POTASSIUM: 4.4 mmol/L (ref 3.5–5.1)
Sodium: 126 mmol/L — ABNORMAL LOW (ref 135–145)

## 2017-08-04 LAB — HIV ANTIBODY (ROUTINE TESTING W REFLEX): HIV Screen 4th Generation wRfx: NONREACTIVE

## 2017-08-04 MED ORDER — LABETALOL HCL 5 MG/ML IV SOLN
10.0000 mg | INTRAVENOUS | Status: DC | PRN
  Filled 2017-08-04: qty 4

## 2017-08-04 NOTE — Progress Notes (Addendum)
[place] [facility] [place] NAME: Rachael Palmer    MR#:  [id number]  DATE OF BIRTH:  [birth year]  SUBJECTIVE: Patient is admitted for back pain and found to have T8 compression fracture.  Patient states that her pain is controlled with pain medicine, patient also has hyponatremia, sodium 117 admission negative to date is 125.  She is ambulating with pain medicine and does not want any home health.  CHIEF COMPLAINT:   Chief Complaint  Patient presents with  . Shortness of Breath  Sodium is improved to 126. but patient still complains of back pain.  Continue IV fluids for hyponatremia but I decreased the rate today.  REVIEW OF SYSTEMS:    Review of Systems  Constitutional: Negative for chills and fever.  HENT: Negative for hearing loss.   Eyes: Negative for blurred vision, double vision and photophobia.  Respiratory: Positive for cough. Negative for hemoptysis and shortness of breath.   Cardiovascular: Negative for palpitations, orthopnea and leg swelling.  Gastrointestinal: Negative for abdominal pain, diarrhea and vomiting.  Genitourinary: Negative for dysuria and urgency.  Musculoskeletal: Positive for back pain. Negative for myalgias and neck pain.  Skin: Negative for rash.  Neurological: Negative for dizziness, focal weakness, seizures, weakness and headaches.  Psychiatric/Behavioral: Negative for memory loss. The patient does not have insomnia.     Nutrition:  Tolerating Diet: Tolerating PT:      DRUG ALLERGIES:   Allergies  Allergen Reactions  . Penicillins Hives, Itching and Rash  . Tape Other (See Comments)    Irritation to skin.  Paper tape ok.  . Aspirin Rash    VITALS:  Blood pressure (!) 161/94, pulse (!) 122, temperature 98.5 F (36.9 C), temperature source Oral, resp. rate 16, height 4\' 10"  (1.473 m), weight 32.1 kg (70 lb 12.8 oz), SpO2 97 %.  PHYSICAL EXAMINATION:   Physical Exam  GENERAL:  62  y.o.-year-old patient lying in the bed with no acute distress.  EYES: Pupils equal, round, reactive to light and accommodation. No scleral icterus. Extraocular muscles intact.  HEENT: Head atraumatic, normocephalic. Oropharynx and nasopharynx clear.  NECK:  Supple, no jugular venous distention. No thyroid enlargement, no tenderness.  LUNGS: expiratory wheeze bilaterally.  History of COPD patient not using accessory muscles of respiration.  NoRales,rhonchi or crepitation. CARDIOVASCULAR: S1, S2 normal. No murmurs, rubs, or gallops.  ABDOMEN: Soft, nontender, nondistended. Bowel sounds present. No organomegaly or mass.  EXTREMITIES: No pedal edema, cyanosis, or clubbing.  NEUROLOGIC: Cranial nerves II through XII are intact. Muscle strength 5/5 in all extremities. Sensation intact. Gait not checked.  PSYCHIATRIC: The patient is alert and oriented x 3.  SKIN: No obvious rash, lesion, or ulcer.    LABORATORY PANEL:   CBC Recent Labs  Lab 08/03/17 0604  WBC 3.7  HGB 11.2*  HCT 33.2*  PLT 304   ------------------------------------------------------------------------------------------------------------------  Chemistries  Recent Labs  Lab 08/02/17 1336  08/04/17 0925  NA 117*   < > 126*  K 4.2   < > 4.4  CL 77*   < > 95*  CO2 27   < > 27  GLUCOSE 89   < > 102*  BUN 7   < > 12  CREATININE 0.57   < > 0.69  CALCIUM 8.4*   < > 8.5*  AST 29  --   --   ALT 13*  --   --   ALKPHOS 63  --   --  BILITOT 1.6*  --   --    < > = values in this interval not displayed.   ------------------------------------------------------------------------------------------------------------------  Cardiac Enzymes Recent Labs  Lab 08/02/17 1336  TROPONINI <0.03   ------------------------------------------------------------------------------------------------------------------  RADIOLOGY:  No results found.   ASSESSMENT AND PLAN:   Principal Problem:   Hyponatremia Active Problems:    Vertebral fracture, osteoporotic ([facility])   COPD exacerbation ([facility])   #1 acute hyponatremia due to dehydration, slowly with IV fluids, sodium from 117-125-126.Marland Kitchen  Continue IV fluids but I decreased the rate. 2.  T8 compression fracture causing back pain, patient pain medication doses are adjusted, she states it is helping, she is able to ambulate to the bathroom.  Likely discharge home tomorrow when she is saying she does not want any home health.  Seen by orthopedic, continue Norco 1-2 tablets every 4 hours, prednisone dose taper.  Zanaflex, Soma 3.  COPD, no wheezing.  Continue inhalers. 4.  Hypertension, BP controlled with Cardizem 60 twice daily. #5 severe malnutrition in the context of chronic illness..   All the records are reviewed and case discussed with Care Management/Social Workerr. Management plans discussed with the patient, family and they are in agreement.  CODE STATUS: full code TOTAL TIME TAKING CARE OF THIS PATIENT: 64minutes.   POSSIBLE D/C IN 1-2 DAYS, DEPENDING ON CLINICAL CONDITION.   Epifanio Lesches M.D on 08/04/2017 at 6:55 PM  Between 7am to 6pm - Pager - [phone]  After 6pm go to [email or web address] - password [email or web address]  [facility]  Office  [phone]  CC: Primary care physician; Kirk Ruths, MD

## 2017-08-05 LAB — BASIC METABOLIC PANEL
ANION GAP: 8 (ref 5–15)
BUN: 18 mg/dL (ref 6–20)
CHLORIDE: 94 mmol/L — AB (ref 101–111)
CO2: 29 mmol/L (ref 22–32)
Calcium: 9 mg/dL (ref 8.9–10.3)
Creatinine, Ser: 0.62 mg/dL (ref 0.44–1.00)
GFR calc non Af Amer: >60 mL/min (ref >60)
Glucose, Bld: 95 mg/dL (ref 65–99)
POTASSIUM: 4.4 mmol/L (ref 3.5–5.1)
Sodium: 131 mmol/L — ABNORMAL LOW (ref 135–145)

## 2017-08-05 MED ORDER — ENSURE ENLIVE PO LIQD
237.0000 mL | Freq: Three times a day (TID) | ORAL | 12 refills | Status: AC
Start: 2017-08-05 — End: ?

## 2017-08-05 MED ORDER — HYDROCODONE-ACETAMINOPHEN 5-325 MG PO TABS: 2.0000 | ORAL_TABLET | Freq: Four times a day (QID) | ORAL | 0 refills | Status: AC | PRN

## 2017-08-05 MED ORDER — DILTIAZEM HCL 60 MG PO TABS: 60.0000 mg | ORAL_TABLET | Freq: Three times a day (TID) | ORAL | 0 refills | Status: AC

## 2017-08-05 MED ORDER — PREDNISONE 10 MG (21) PO TBPK: ORAL_TABLET | ORAL | 0 refills | Status: AC

## 2017-08-05 MED ORDER — DILTIAZEM HCL 60 MG PO TABS
60.0000 mg | ORAL_TABLET | Freq: Four times a day (QID) | ORAL | Status: DC
  Filled 2017-08-05: qty 1

## 2017-08-05 NOTE — Care Management (Signed)
RNCM spoke with patient through her RN by phone.  Patient is requesting a wheelchair and states she does not need a cane. There is no medical necessity for wheelchair and her insurance will not pay for a wheelchair. Patient then told RNCM she has a walker and everything- she does not need a wheelchair.  Follow up with Dr. Ouida Sills made for Monday April 22 at 3:15PM.  Patient agrees to appointment. Sister will provide transportation.

## 2017-08-05 NOTE — Care Management Important Message (Signed)
Copy left in patient's room.

## 2017-08-05 NOTE — Progress Notes (Signed)
Patient is being discharged home today to self care. Sister is taking care of her. DC & RX instructions given and patient acknowledged understanding.   IV removed. RN called to make sure her RX were ready and they are. NT taking patient to sisters car for transport home.

## 2017-08-11 NOTE — Discharge Summary (Signed)
Rachael Palmer, is a 62 y.o. female  DOB Oct 31, 1955  MRN [id number].  Admission date:  08/02/2017  Admitting Physician  Vaughan Basta, MD  Discharge Date:  08/05/2017   Primary MD  Kirk Ruths, MD  Recommendations for primary care physician for things to follow:   Follow with PCP in 1 week   Admission Diagnosis  Hyponatremia [E87.1] COPD exacerbation ([place]) [J44.1] Thoracic compression fracture, closed, initial encounter ([place]) [S22.000A]   Discharge Diagnosis  Hyponatremia [E87.1] COPD exacerbation ([place]) [J44.1] Thoracic compression fracture, closed, initial encounter ([place]) [S22.000A]    Principal Problem:   Hyponatremia Active Problems:   Vertebral fracture, osteoporotic ([facility])   COPD exacerbation ([place])      Past Medical History:  Diagnosis Date  . Alcohol abuse   . Asthma   . COPD (chronic obstructive pulmonary disease) ([place])   . Hypertension   . Lung mass   . Mass of lower lobe of right lung 12/11/2014  . Squamous cell carcinoma lung ([place])   . Squamous cell carcinoma of lung, stage I ([place]) 01/03/2015    Past Surgical History:  Procedure Laterality Date  . broken arm repair Right   . CESAREAN SECTION     x 3  . ELECTROMAGNETIC NAVIGATION BROCHOSCOPY N/A 12/26/2014   Procedure: ELECTROMAGNETIC NAVIGATION BRONCHOSCOPY;  Surgeon: Flora Lipps, MD;  Location: [facility] ORS;  Service: Cardiopulmonary;  Laterality: N/A;  . FEMUR IM NAIL Left 12/01/2014   Procedure: INTRAMEDULLARY (IM) NAIL FEMORAL;  Surgeon: Thornton Park, MD;  Location: [facility] ORS;  Service: Orthopedics;  Laterality: Left;  . HIP PINNING Left 2016  . HIP SURGERY Right 2013       History of present illness and  Hospital Course:     Kindly see H&P for history of present illness and admission details, please review complete Labs,  Consult reports and Test reports for all details in brief  HPI  from the history and physical done on the day of admission 62 year old female patient with history of alcohol abuse, COPD, hospital, history of lung cancer comes in because of shortness of breath, back pain patient found to have hyponatremia, wheezing.  He also has low back pain.   Hospital Course  #1. acute hyponatremia secondary to dehydration, sodium 117 when she came, improved with IV fluids, sodium improved to 131 at discharge. 2.  T8 compression fracture causing back pain, improved with pain medicine, patient able to ambulate with pain medicine / prescribed Norco 1-2 tablets every 4 hours, Zanaflex, patient is given prednisone dose taper patient already takes Soma at home.  At home health physical therapy. 3.  COPD, no wheezing.  Continue inhalers. 4.  Hypertension: Controlled, continue continue Cardizem 60 mg p.o. twice daily  Severe malnutrition in the context of chronic illness.  Advised the patient to continue Ensure or boost..  Stopped torsemide  At discharge because of hyponatremia.   Discharge Condition: stable   Follow UP  Follow-up Information    Kirk Ruths, MD. Go on 08/11/2017.   Specialty:  Internal Medicine Why:  AT [address] information: [place] [address] [phone]        Florene Glen, MD On 08/10/2017.   Specialty:  Surgery Why:  AT 4PM Contact information: [address] [place] [address] [phone]             Discharge Instructions  and  Discharge Medications      Allergies as of 08/05/2017  Reactions   Penicillins Hives, Itching, Rash   Tape Other (See Comments)   Irritation to skin.  Paper tape ok.   Aspirin Rash      Medication List    STOP taking these medications   enoxaparin 30 MG/0.3ML injection Commonly known as:  LOVENOX   oxyCODONE-acetaminophen 5-325 MG tablet Commonly known as:   PERCOCET/ROXICET   potassium chloride 10 MEQ tablet Commonly known as:  K-DUR   torsemide 5 MG tablet Commonly known as:  DEMADEX   traMADol 50 MG tablet Commonly known as:  ULTRAM   traZODone 100 MG tablet Commonly known as:  DESYREL     TAKE these medications   aspirin 81 MG chewable tablet Chew 81 mg by mouth daily.   calcium-vitamin D 500-200 MG-UNIT tablet Commonly known as:  OSCAL WITH D Take 1 tablet by mouth daily with breakfast.   carisoprodol 350 MG tablet Commonly known as:  SOMA Take 175 mg by mouth 2 (two) times daily as needed for muscle spasms.   diltiazem 60 MG tablet Commonly known as:  CARDIZEM Take 1 tablet (60 mg total) by mouth 3 (three) times daily. What changed:  when to take this   feeding supplement (ENSURE ENLIVE) Liqd Take 237 mLs by mouth 3 (three) times daily between meals.   Fluticasone-Salmeterol 250-50 MCG/DOSE Aepb Commonly known as:  ADVAIR Inhale 1 puff into the lungs 2 (two) times daily.   HYDROcodone-acetaminophen 5-325 MG tablet Commonly known as:  NORCO/VICODIN Take 2 tablets by mouth every 6 (six) hours as needed for moderate pain. What changed:  how much to take   ibandronate 150 MG tablet Commonly known as:  BONIVA TAKE 1 TABLET BY MOUTH EVERY 30 DAYS WITH FULL GLASS OF WATER. DO NOT LIE DOWN FOR 60 MINS   Omeprazole 20 MG Tbdd Take 20 mg by mouth every morning.   predniSONE 10 MG (21) Tbpk tablet Commonly known as:  STERAPRED UNI-PAK 21 TAB Taper by 10 mg daily   PROAIR HFA 108 (90 Base) MCG/ACT inhaler Generic drug:  albuterol INHALE 2 INHALATIONS INTO THE LUNGS EVERY 4 (FOUR) HOURS AS NEEDED FOR WHEEZING.         Diet and Activity recommendation: See Discharge Instructions above   Consults obtained -Physical  therapy   Major procedures and Radiology Reports - PLEASE review detailed and final reports for all details, in brief -      Dg Chest 2 View  Result Date: 08/02/2017 CLINICAL DATA:  Shortness  of breath and right-sided back pain for the past 4 days with increasing severity today. History of COPD, right-sided lung malignancy, asthma, and cough. Current smoker. EXAM: CHEST - 2 VIEW COMPARISON:  CT scan of the chest of March 30, 2017 and chest x-ray of December 01, 2014. FINDINGS: The lungs are hyperinflated. The interstitial markings are coarse. There is no alveolar infiltrate. There is no pleural effusion. The heart and pulmonary vascularity are normal. The mediastinum is normal in width. There is calcification in the wall of the thoracic aorta. There is multilevel partial vertebral body compression of the thoracic spine. IMPRESSION: Multiple thoracic compression fractures. The T8 compression appears new since December 2018. Partial compressions of T4, T5, and T6 and T10, T11, and T12 are stable. COPD. No alveolar pneumonia. No definite pulmonary parenchymal masses. No CHF. Thoracic aortic atherosclerosis. Electronically Signed   By: David  Martinique M.D.   On: 08/02/2017 14:04   Ct Chest W Contrast  Result Date: 08/02/2017 CLINICAL DATA:  Shortness of breath.  History of lung cancer. EXAM: CT CHEST WITH CONTRAST TECHNIQUE: Multidetector CT imaging of the chest was performed during intravenous contrast administration. CONTRAST:  30mL OMNIPAQUE IOHEXOL 300 MG/ML  SOLN COMPARISON:  CT scan of March 31, 2017. FINDINGS: Cardiovascular: Atherosclerosis of thoracic aorta is noted without aneurysm or dissection. Normal cardiac size. No pericardial effusion. Mediastinum/Nodes: Stable right hilar density is noted with associated calcifications. Thyroid gland, trachea, and esophagus demonstrate no significant findings. Lungs/Pleura: No pneumothorax or pleural effusion is noted. Hyperexpansion of the lungs is noted with emphysematous disease seen in both upper lobes. Large irregular lesion seen in right lower lobe currently measures 5.0 x 1.9 cm which is not significantly changed compared to prior exam. Right upper  lobe nodule noted on prior exam is no longer present. Upper Abdomen: No acute abnormality. Musculoskeletal: No chest wall abnormality. No acute or significant osseous findings. IMPRESSION: Stable large irregular lesion seen in right lower lobe compared to prior exam. Stable partially calcified right hilar density is also noted. Aortic Atherosclerosis (ICD10-I70.0) and Emphysema (ICD10-J43.9). Electronically Signed   By: Marijo Conception, M.D.   On: 08/02/2017 16:32    Micro Results     No results found for this or any previous visit (from the past 240 hour(s)).     Today   Subjective:   Skylor Bluett today has no headache,no chest abdominal pain,no new weakness tingling or numbness, feels much better wants to go home today.   Objective:   Blood pressure (!) 164/92, pulse (!) 128, temperature 98.5 F (36.9 C), temperature source Oral, resp. rate 20, height 4\' 10"  (1.473 m), weight 32.1 kg (70 lb 12.8 oz), SpO2 97 %.  No intake or output data in the 24 hours ending 08/11/17 1259  Exam Awake Alert, Oriented x 3, No new F.N deficits, Normal affect [place].AT,PERRAL Supple Neck,No JVD, No cervical lymphadenopathy appriciated.  Symmetrical Chest wall movement, Good air movement bilaterally, CTAB RRR,No Gallops,Rubs or new Murmurs, No Parasternal Heave +ve B.Sounds, Abd Soft, Non tender, No organomegaly appriciated, No rebound -guarding or rigidity. No Cyanosis, Clubbing or edema, No new Rash or bruise  Data Review   CBC w Diff:  Lab Results  Component Value Date   WBC 3.7 08/03/2017   HGB 11.2 (L) 08/03/2017   HGB 8.8 (L) 10/29/2012   HCT 33.2 (L) 08/03/2017   HCT 24.9 (L) 10/29/2012   PLT 304 08/03/2017   PLT 275 10/29/2012   LYMPHOPCT 23.0 10/29/2012   MONOPCT 9.8 10/29/2012   EOSPCT 0.5 10/29/2012   BASOPCT 0.3 10/29/2012    CMP:  Lab Results  Component Value Date   NA 131 (L) 08/05/2017   NA 128 (L) 10/29/2012   K 4.4 08/05/2017   K 3.6 10/29/2012   CL 94 (L)  08/05/2017   CL 91 (L) 10/29/2012   CO2 29 08/05/2017   CO2 34 (H) 10/29/2012   BUN 18 08/05/2017   BUN 3 (L) 10/29/2012   CREATININE 0.62 08/05/2017   CREATININE 0.68 10/29/2012   PROT 7.3 08/02/2017   PROT 6.6 10/24/2012   ALBUMIN 4.2 08/02/2017   ALBUMIN 4.0 10/24/2012   BILITOT 1.6 (H) 08/02/2017   BILITOT 0.6 10/24/2012   ALKPHOS 63 08/02/2017   ALKPHOS 80 10/24/2012   AST 29 08/02/2017   AST 40 (H) 10/24/2012   ALT 13 (L) 08/02/2017   ALT 25 10/24/2012  .   Total Time in preparing paper work, data evaluation and todays exam - 35 minutes  Epifanio Lesches M.D on 08/05/2017 at 12:59 PM    Note: This dictation was prepared with Dragon dictation along with smaller phrase technology. Any transcriptional errors that result from this process are unintentional.

## 2017-10-31 ENCOUNTER — Ambulatory Visit: Payer: Medicare Other

## 2017-11-09 ENCOUNTER — Ambulatory Visit: Payer: Medicare Other | Admitting: Radiation Oncology

## 2018-12-09 IMAGING — CR DG CHEST 2V
2 series · 2 of 2 positions shown · non-contrast
Comparison: CT scan of the chest March 30, 2017 and chest
x-ray December 01, 2014.

CLINICAL DATA: Shortness of breath and right-sided back pain for
the past 4 days with increasing severity today. History of COPD,
right-sided lung malignancy, asthma, and cough. Current smoker.

EXAM:
CHEST - 2 VIEW

[chest pa]
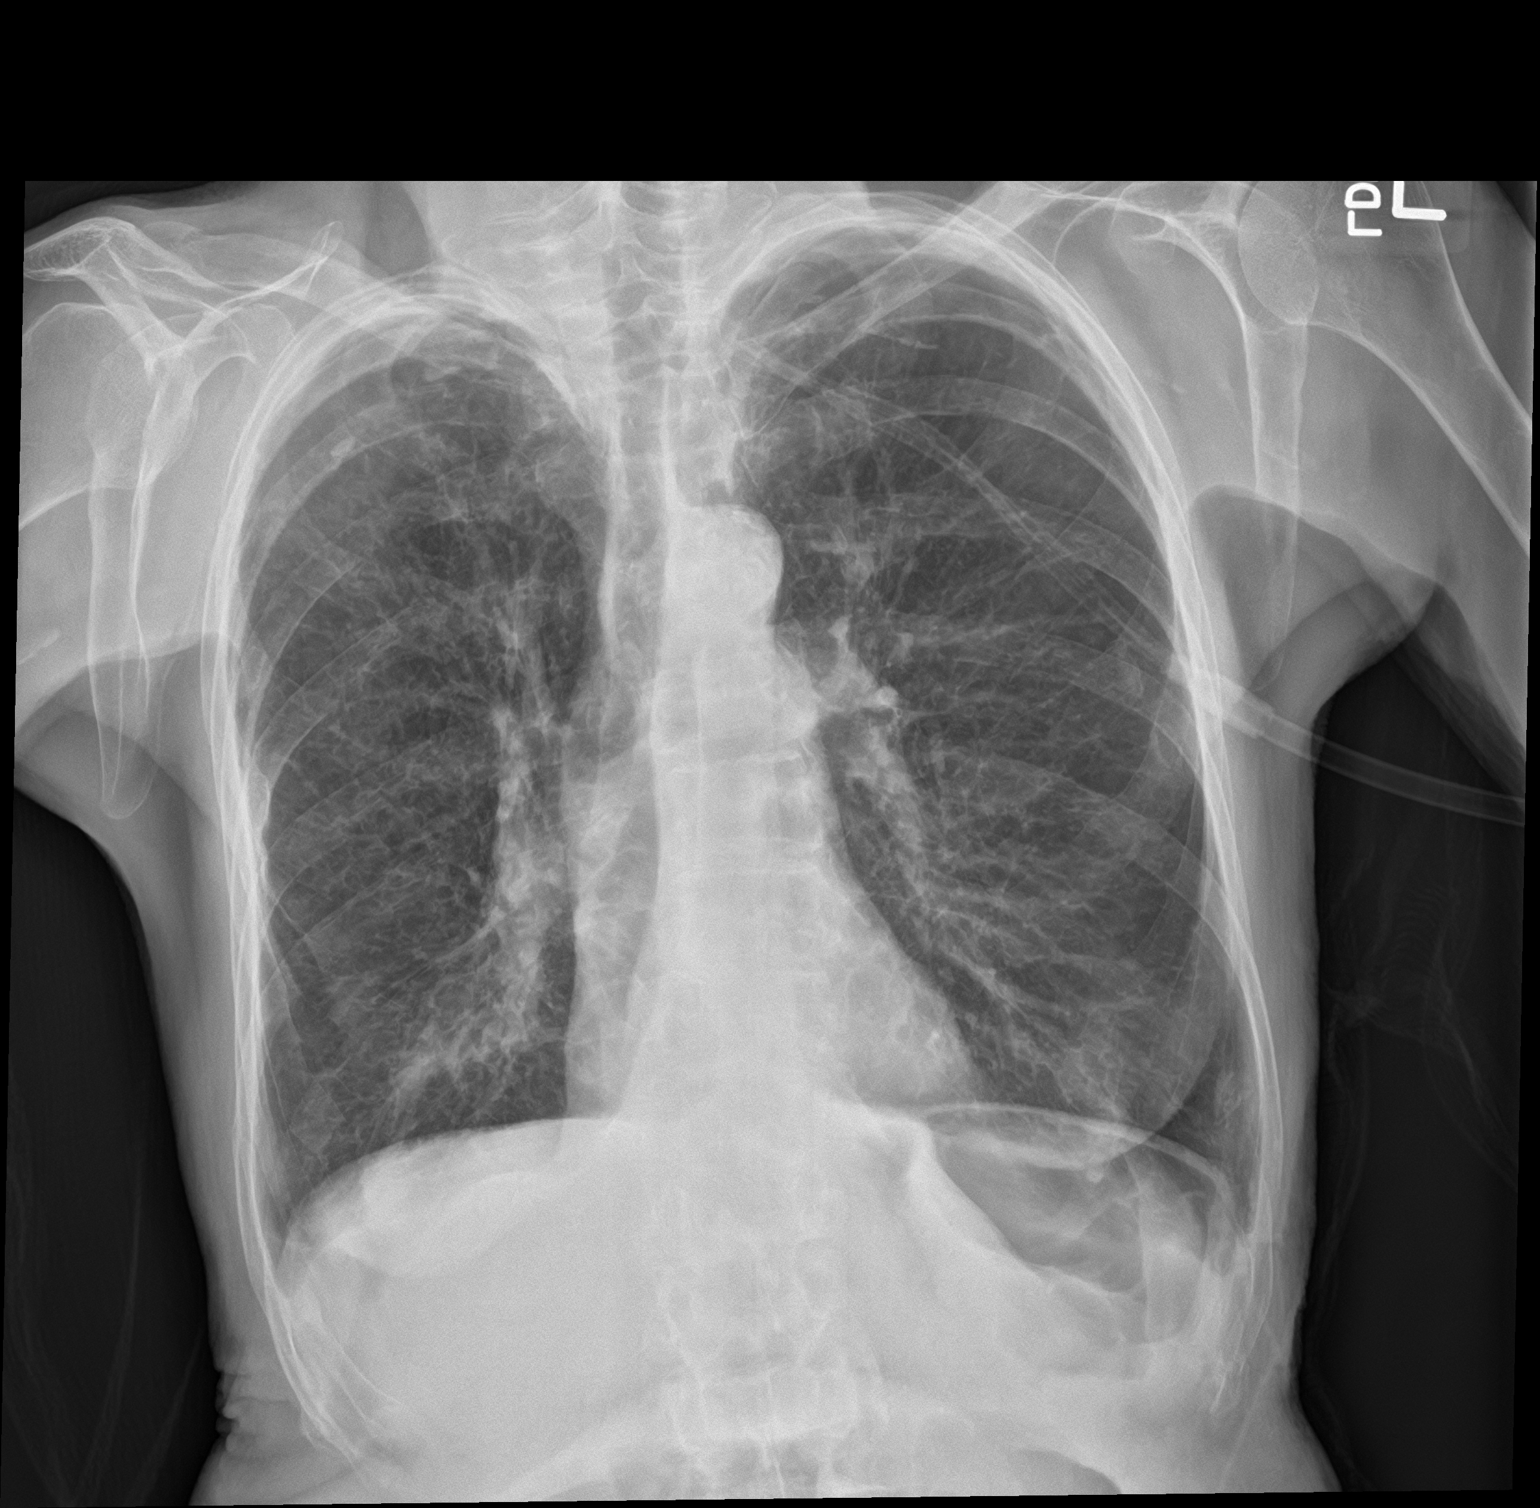

[chest lat]
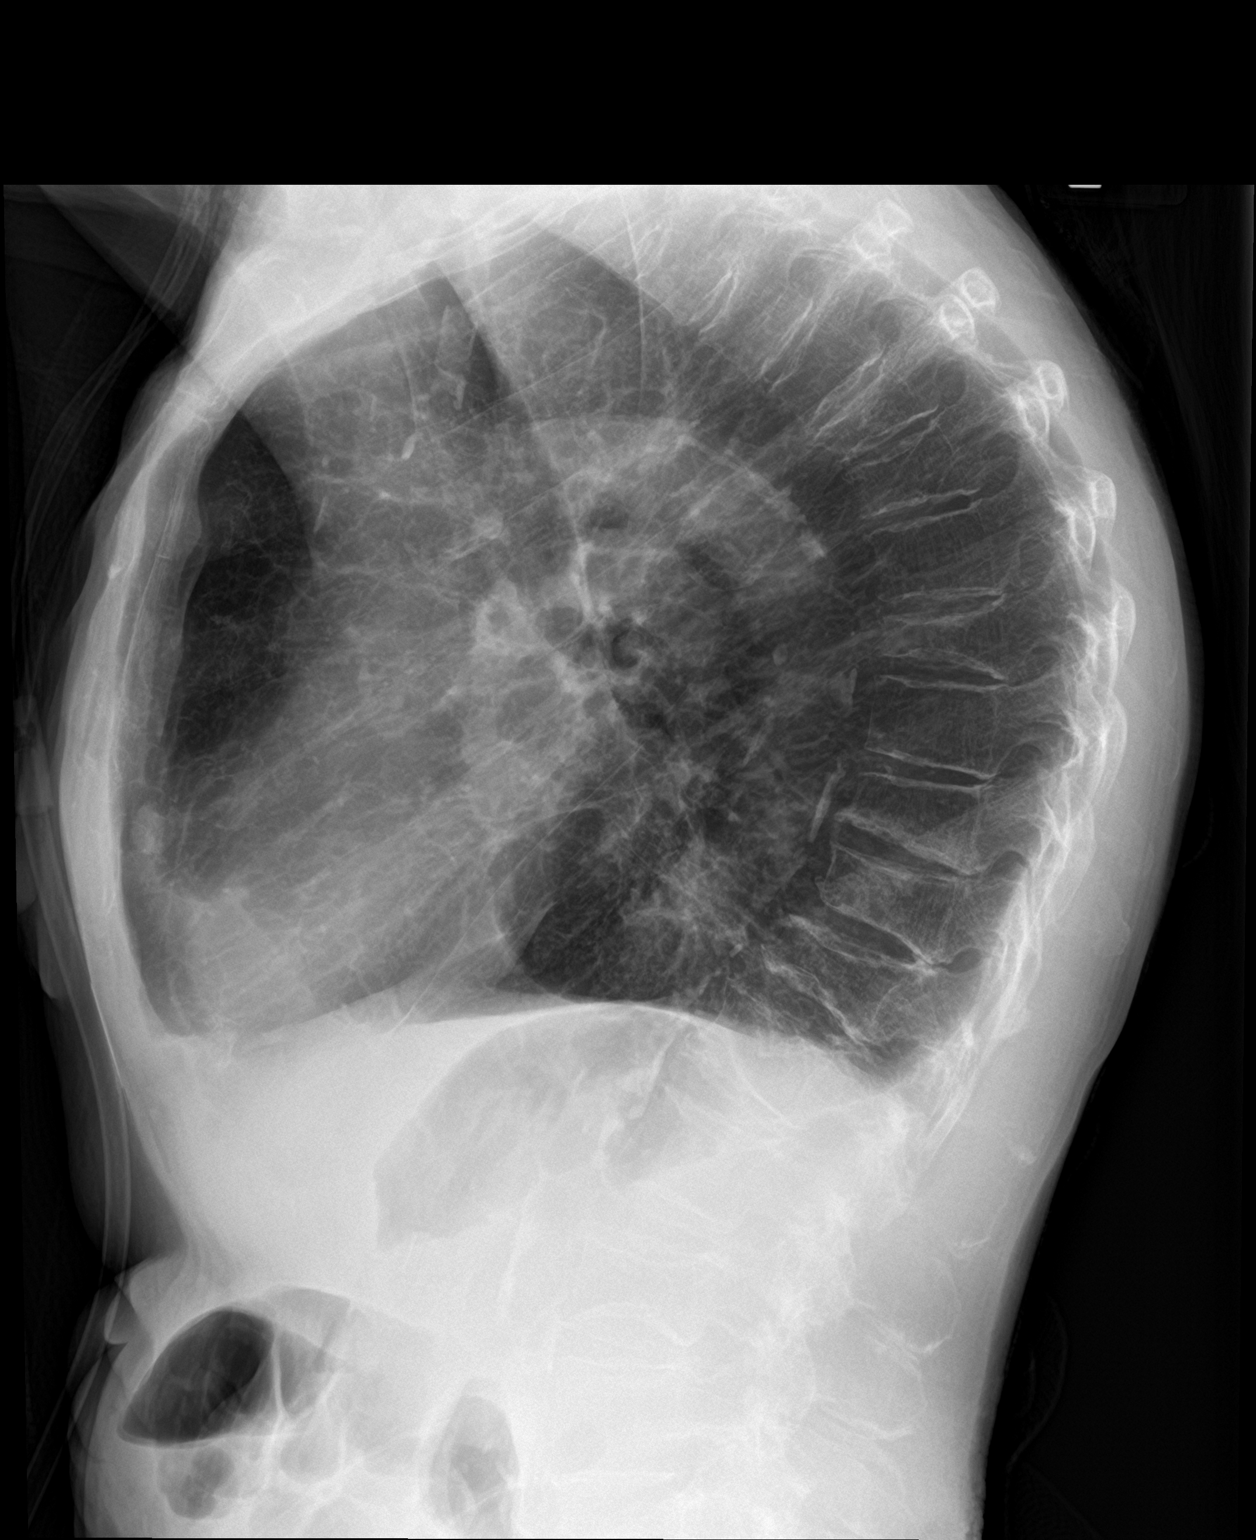

[2 of 2 positions shown; findings below may reference images not displayed]

FINDINGS: The lungs are hyperinflated. The interstitial markings are coarse.
There is no alveolar infiltrate. There is no pleural effusion. The
heart and pulmonary vascularity are normal. The mediastinum is
normal in width. There is calcification in the wall of the thoracic
aorta. There is multilevel partial vertebral body compression of the
thoracic spine.
IMPRESSION: Multiple thoracic compression fractures. The T8 compression appears
new since March 2017. Partial compressions of T4, T5, and T6 and
T10, T11, and T12 are stable.

COPD. No alveolar pneumonia. No definite pulmonary parenchymal
masses. No CHF.

Thoracic aortic atherosclerosis.

## 2019-08-25 DEATH — deceased
